# Patient Record
Sex: Female | Born: 1945 | Race: White | Hispanic: No | Marital: Married | Smoking: Never smoker
Health system: Southern US, Community
[De-identification: ages and names within clinical notes are randomized; demographics above are authoritative.]

## PROBLEM LIST (undated history)

## (undated) DIAGNOSIS — H269 Unspecified cataract: Secondary | ICD-10-CM

## (undated) DIAGNOSIS — M8580 Other specified disorders of bone density and structure, unspecified site: Secondary | ICD-10-CM

## (undated) DIAGNOSIS — R112 Nausea with vomiting, unspecified: Secondary | ICD-10-CM

## (undated) DIAGNOSIS — Z800 Family history of malignant neoplasm of digestive organs: Secondary | ICD-10-CM

## (undated) DIAGNOSIS — Z9889 Other specified postprocedural states: Secondary | ICD-10-CM

## (undated) HISTORY — PX: COLONOSCOPY: SHX174

## (undated) HISTORY — DX: Family history of malignant neoplasm of digestive organs: Z80.0

## (undated) HISTORY — PX: TUBAL LIGATION: SHX77

## (undated) HISTORY — DX: Other specified disorders of bone density and structure, unspecified site: M85.80

## (undated) HISTORY — PX: TYMPANOPLASTY: SHX33

## (undated) HISTORY — PX: EYE SURGERY: SHX253

## (undated) HISTORY — PX: WISDOM TOOTH EXTRACTION: SHX21

## (undated) HISTORY — DX: Unspecified cataract: H26.9

---

## 1998-05-21 ENCOUNTER — Other Ambulatory Visit: Admission: RE | Admit: 1998-05-21 | Discharge: 1998-05-21 | Payer: Self-pay | Admitting: Obstetrics and Gynecology

## 1998-09-04 ENCOUNTER — Other Ambulatory Visit: Admission: RE | Admit: 1998-09-04 | Discharge: 1998-09-04 | Payer: Self-pay | Admitting: Obstetrics and Gynecology

## 1999-01-20 ENCOUNTER — Other Ambulatory Visit: Admission: RE | Admit: 1999-01-20 | Discharge: 1999-01-20 | Payer: Self-pay | Admitting: Obstetrics and Gynecology

## 1999-05-25 ENCOUNTER — Other Ambulatory Visit: Admission: RE | Admit: 1999-05-25 | Discharge: 1999-05-25 | Payer: Self-pay | Admitting: Obstetrics and Gynecology

## 1999-11-20 ENCOUNTER — Other Ambulatory Visit: Admission: RE | Admit: 1999-11-20 | Discharge: 1999-11-20 | Payer: Self-pay | Admitting: Obstetrics and Gynecology

## 2000-07-29 ENCOUNTER — Ambulatory Visit (HOSPITAL_COMMUNITY): Admission: RE | Admit: 2000-07-29 | Discharge: 2000-07-29 | Payer: Self-pay | Admitting: Gastroenterology

## 2001-07-03 ENCOUNTER — Other Ambulatory Visit: Admission: RE | Admit: 2001-07-03 | Discharge: 2001-07-03 | Payer: Self-pay | Admitting: Obstetrics and Gynecology

## 2002-07-06 ENCOUNTER — Other Ambulatory Visit: Admission: RE | Admit: 2002-07-06 | Discharge: 2002-07-06 | Payer: Self-pay | Admitting: Obstetrics and Gynecology

## 2003-07-08 ENCOUNTER — Other Ambulatory Visit: Admission: RE | Admit: 2003-07-08 | Discharge: 2003-07-08 | Payer: Self-pay | Admitting: Obstetrics and Gynecology

## 2003-08-13 ENCOUNTER — Encounter: Admission: RE | Admit: 2003-08-13 | Discharge: 2003-08-27 | Payer: Self-pay | Admitting: Family Medicine

## 2004-07-28 ENCOUNTER — Other Ambulatory Visit: Admission: RE | Admit: 2004-07-28 | Discharge: 2004-07-28 | Payer: Self-pay | Admitting: Obstetrics and Gynecology

## 2005-08-09 ENCOUNTER — Other Ambulatory Visit: Admission: RE | Admit: 2005-08-09 | Discharge: 2005-08-09 | Payer: Self-pay | Admitting: Obstetrics & Gynecology

## 2010-07-01 ENCOUNTER — Ambulatory Visit (INDEPENDENT_AMBULATORY_CARE_PROVIDER_SITE_OTHER): Payer: Medicare PPO | Admitting: Internal Medicine

## 2010-07-01 ENCOUNTER — Encounter: Payer: Self-pay | Admitting: Internal Medicine

## 2010-07-01 DIAGNOSIS — Z13220 Encounter for screening for lipoid disorders: Secondary | ICD-10-CM

## 2010-07-01 DIAGNOSIS — Z23 Encounter for immunization: Secondary | ICD-10-CM

## 2010-07-01 DIAGNOSIS — M899 Disorder of bone, unspecified: Secondary | ICD-10-CM

## 2010-07-01 DIAGNOSIS — M8580 Other specified disorders of bone density and structure, unspecified site: Secondary | ICD-10-CM | POA: Insufficient documentation

## 2010-07-01 DIAGNOSIS — Z800 Family history of malignant neoplasm of digestive organs: Secondary | ICD-10-CM | POA: Insufficient documentation

## 2010-07-01 DIAGNOSIS — Z79899 Other long term (current) drug therapy: Secondary | ICD-10-CM

## 2010-07-01 DIAGNOSIS — H409 Unspecified glaucoma: Secondary | ICD-10-CM

## 2010-07-01 DIAGNOSIS — Z0000 Encounter for general adult medical examination without abnormal findings: Secondary | ICD-10-CM

## 2010-07-01 NOTE — Assessment & Plan Note (Signed)
Asymptomatic. Reviewed screening guidelines in the face of family history. Recommend five-year followup with last colonoscopy October 2007

## 2010-07-01 NOTE — Assessment & Plan Note (Signed)
Asx. Followup with optho as scheduled

## 2010-07-01 NOTE — Progress Notes (Signed)
  Subjective:    Patient ID: Tarri Fuller, female    DOB: 26-Mar-1945, 65 y.o.   MRN: [id number]  HPI patient is escorted to establish primary medical care and for followup of osteopenia. Outside records reviewed up to two thousand nine. States was diagnosed with osteopenia approximately 5 years ago with no recommendation for treatment other than over-the-counter calcium and vitamin D. Maintains regular exercise routine. Has no history of fracture. Is due for annual mammogram and is willing to have this scheduled. Pap smear today and reportedly normal July 2011 performed by gynecology. Has been diagnosed with glaucoma which apparently is mild and is followed closely by ophthalmology. Does have family history of colon cancer with first-degree relative. His personal history of colon polyps with last colonoscopy October 2007 demonstrating internal hemorrhoids and diverticula only. Recommendation was for five year followup. Remains asymptomatic without change in bowel habits or rectal bleeding. No other complaints.  Reviewed past medical history, past surgical history, medications, allergies, social history and family history  Review of Systems  Constitutional: Negative for fever, chills and fatigue.  HENT: Negative for congestion, sore throat and neck pain.   Eyes: Negative for pain, discharge and visual disturbance.  Respiratory: Negative for cough, shortness of breath and wheezing.   Cardiovascular: Negative for chest pain and palpitations.  Gastrointestinal: Negative for abdominal pain, diarrhea, constipation and blood in stool.  Genitourinary: Negative for hematuria, decreased urine volume and difficulty urinating.  Musculoskeletal: Negative for back pain and arthralgias.  Neurological: Negative for dizziness, syncope and weakness.  Hematological: Negative for adenopathy. Does not bruise/bleed easily.  Psychiatric/Behavioral: Negative for behavioral problems, confusion and agitation.         Objective:   Physical Exam    Physical Exam  [nursing notereviewed. Constitutional:  appears well-developed and well-nourished. No distress.  HENT:  Head: Normocephalic and atraumatic.  Right Ear: Tympanic membrane, external ear and ear canal normal.  Left Ear: Tympanic membrane, external ear and ear canal normal.  Nose: Nose normal.  Mouth/Throat: Oropharynx is clear and moist. No oropharyngeal exudate.  Eyes: Conjunctivae are normal. No scleral icterus.  Neck: Neck supple.  Cardiovascular: Normal rate, regular rhythm and normal heart sounds.  Exam reveals no gallop and no friction rub.   No murmur heard. Pulmonary/Chest: Effort normal and breath sounds normal. No respiratory distress.  no wheezes.  no rales.  Lymphadenopathy:     no cervical adenopathy.  Neurological:  alert.  Skin: Skin is warm and dry.  not diaphoretic.      Assessment & Plan:

## 2010-07-02 ENCOUNTER — Other Ambulatory Visit: Payer: Self-pay | Admitting: Internal Medicine

## 2010-07-02 DIAGNOSIS — Z1231 Encounter for screening mammogram for malignant neoplasm of breast: Secondary | ICD-10-CM

## 2010-07-02 DIAGNOSIS — M8580 Other specified disorders of bone density and structure, unspecified site: Secondary | ICD-10-CM

## 2010-07-02 LAB — LIPID PANEL
Cholesterol: 178 mg/dL (ref 0–200)
HDL: 53.7 mg/dL
LDL Cholesterol: 112 mg/dL — ABNORMAL HIGH (ref 0–99)
Total CHOL/HDL Ratio: 3
Triglycerides: 61 mg/dL (ref 0.0–149.0)
VLDL: 12.2 mg/dL (ref 0.0–40.0)

## 2010-07-02 LAB — BASIC METABOLIC PANEL
CO2: 27 mEq/L (ref 19–32)
Calcium: 9.2 mg/dL (ref 8.4–10.5)
Creatinine, Ser: 1 mg/dL (ref 0.4–1.2)
Glucose, Bld: 79 mg/dL (ref 70–99)

## 2010-07-02 NOTE — Progress Notes (Signed)
Addended by: Serena Colonel on: 07/02/2010 08:37 AM   Modules accepted: Orders

## 2010-07-06 ENCOUNTER — Telehealth: Payer: Self-pay

## 2010-07-06 NOTE — Telephone Encounter (Signed)
Pt's husband notified.

## 2010-07-06 NOTE — Telephone Encounter (Signed)
Message copied by Beverely Low on Mon Jul 06, 2010  3:30 PM ------      Message from: Staci Righter      Created: Sun Jul 05, 2010 11:38 AM       Labs nl

## 2010-07-09 ENCOUNTER — Other Ambulatory Visit: Payer: Medicare PPO

## 2010-08-04 ENCOUNTER — Ambulatory Visit: Payer: Medicare PPO

## 2010-08-05 ENCOUNTER — Telehealth: Payer: Self-pay | Admitting: Internal Medicine

## 2010-08-05 NOTE — Telephone Encounter (Signed)
Please advise, I can do the order

## 2010-08-05 NOTE — Telephone Encounter (Signed)
Pt called and said that she would like to remain at [facility]. Pt said that Dr Rodena Medin had ordered a dexa scan for pt a [facility], but they do not take pts insurance. Pt is req to get another order for bone density, sent to [facility] phone # [phone] and fax # [phone].

## 2010-08-05 NOTE — Telephone Encounter (Signed)
Order faxed, confirmation received

## 2010-08-05 NOTE — Telephone Encounter (Signed)
OK to order 

## 2010-09-29 LAB — HM MAMMOGRAPHY: HM Mammogram: NEGATIVE

## 2010-10-15 ENCOUNTER — Encounter: Payer: Self-pay | Admitting: Internal Medicine

## 2010-10-15 ENCOUNTER — Encounter: Payer: Self-pay | Admitting: Family Medicine

## 2010-10-19 ENCOUNTER — Ambulatory Visit (INDEPENDENT_AMBULATORY_CARE_PROVIDER_SITE_OTHER): Payer: PRIVATE HEALTH INSURANCE | Admitting: Internal Medicine

## 2010-10-19 ENCOUNTER — Encounter: Payer: Self-pay | Admitting: Internal Medicine

## 2010-10-19 ENCOUNTER — Ambulatory Visit (HOSPITAL_BASED_OUTPATIENT_CLINIC_OR_DEPARTMENT_OTHER)
Admission: RE | Admit: 2010-10-19 | Discharge: 2010-10-19 | Disposition: A | Discharge status: Home / Self Care | Payer: PRIVATE HEALTH INSURANCE | Source: Ambulatory Visit | Attending: Internal Medicine | Admitting: Internal Medicine

## 2010-10-19 VITALS — BP 104/70 | HR 69 | Temp 97.9°F | Resp 16 | Ht 67.0 in | Wt 139.0 lb

## 2010-10-19 DIAGNOSIS — M79672 Pain in left foot: Secondary | ICD-10-CM

## 2010-10-19 DIAGNOSIS — M79609 Pain in unspecified limb: Secondary | ICD-10-CM

## 2010-10-19 DIAGNOSIS — M899 Disorder of bone, unspecified: Secondary | ICD-10-CM

## 2010-10-19 DIAGNOSIS — M8580 Other specified disorders of bone density and structure, unspecified site: Secondary | ICD-10-CM

## 2010-10-19 MED ORDER — DICLOFENAC SODIUM 75 MG PO TBEC: DELAYED_RELEASE_TABLET | ORAL | Status: AC

## 2010-10-19 NOTE — Assessment & Plan Note (Signed)
Reviewed updated bmd. Continue calcium and vit d supplementation.

## 2010-10-19 NOTE — Progress Notes (Signed)
  Subjective:    Patient ID: Stacy White, female    DOB: [birth year], 65 y.o.   MRN: [id number]  HPI Pt presents to clinic for evaluation of foot pain. Notes 2wk h/o left foot pain now improved. Originally occurred while running the end of a 5k race. Pain developed but was able to finish. After finishing had difficulty with wt bear and used crunches temporarily. There was no specific injury or trauma. Pain has slowed improved and is entirely off crutch support. Does note pain at posterior upper heel. Also pain worse in am with initial steps and improves during day. No arch pain. Has taken otc nsaid periodically and initially attempted ice and elevation. No other alleviating or exacerbating factors. Reviewed recent BMD mildly worse but c/w low bone mass without osteoporosis. Continues to take calcium and vitamin d supplementation. No other complaints.  Past Medical History  Diagnosis Date  . Glaucoma    Past Surgical History  Procedure Date  . Tubal ligation   . Tympanoplasty     reports that she has never smoked. She has never used smokeless tobacco. She reports that she does not drink alcohol or use illicit drugs. family history includes Cancer in her father and Hypertension in her father and mother. No Known Allergies     Review of Systems see hpi     Objective:   Physical Exam  Nursing note and vitals reviewed. Constitutional: She appears well-developed. No distress.  HENT:  Head: Normocephalic and atraumatic.  Musculoskeletal:       Left foot without swelling or bony abn. FROM including achilles tendon. No heel tenderness. Able to wt bear and ambulate without assistance.   Neurological: She is alert.  Skin: Skin is warm and dry. She is not diaphoretic.  Psychiatric: She has a normal mood and affect.          Assessment & Plan:

## 2010-10-19 NOTE — Assessment & Plan Note (Signed)
Obtain plain radiograph of left foot. Dc otc nsaid. Begin voltaren with food and no other nsaids. Followup if no improvement or worsening.

## 2010-10-26 ENCOUNTER — Ambulatory Visit (AMBULATORY_SURGERY_CENTER): Payer: PRIVATE HEALTH INSURANCE | Admitting: *Deleted

## 2010-10-26 ENCOUNTER — Telehealth: Payer: Self-pay | Admitting: *Deleted

## 2010-10-26 VITALS — Ht 67.0 in | Wt 141.0 lb

## 2010-10-26 DIAGNOSIS — Z1211 Encounter for screening for malignant neoplasm of colon: Secondary | ICD-10-CM

## 2010-10-26 MED ORDER — PEG-KCL-NACL-NASULF-NA ASC-C 100 G PO SOLR: ORAL | Status: DC

## 2010-10-26 NOTE — Telephone Encounter (Signed)
Pt is former pt of Dr. Ewing Schlein.  We have records from 2002, but not records from colonoscopy 2007.  Pt has hx colon polyps and mother had colon cancer.  Colonoscopy scheduled for Monday 10/22 with Dr. Rhea Belton.  Release of information form signed and given to Chales Abrahams, CMA.   Ezra Sites

## 2010-10-26 NOTE — Progress Notes (Signed)
Pt is former pt of Dr. Magod.  We have records from 2002, but not records from colonoscopy 2007.  Pt has hx colon polyps and mother had colon cancer.  Colonoscopy scheduled for Monday 10/22 with Dr. Pyrtle.  Release of information form signed and given to Patty Lewis, CMA.   Meghan Warshawsky  

## 2010-10-28 NOTE — Telephone Encounter (Signed)
Release faxed. 11/09/10 appt is scheduled.

## 2010-10-30 ENCOUNTER — Telehealth: Payer: Self-pay | Admitting: Internal Medicine

## 2010-10-30 NOTE — Telephone Encounter (Signed)
Forwarded to Dr. Pyrtle for review.  °

## 2010-11-09 ENCOUNTER — Ambulatory Visit (AMBULATORY_SURGERY_CENTER): Payer: 59 | Admitting: Internal Medicine

## 2010-11-09 ENCOUNTER — Encounter: Payer: Self-pay | Admitting: Internal Medicine

## 2010-11-09 VITALS — BP 159/77 | HR 68 | Temp 98.1°F | Resp 18 | Ht 67.0 in | Wt 141.0 lb

## 2010-11-09 DIAGNOSIS — Z800 Family history of malignant neoplasm of digestive organs: Secondary | ICD-10-CM

## 2010-11-09 DIAGNOSIS — Z1211 Encounter for screening for malignant neoplasm of colon: Secondary | ICD-10-CM

## 2010-11-09 DIAGNOSIS — D126 Benign neoplasm of colon, unspecified: Secondary | ICD-10-CM

## 2010-11-09 MED ORDER — SODIUM CHLORIDE 0.9 % IV SOLN: 500.0000 mL | INTRAVENOUS | Status: DC

## 2010-11-09 NOTE — Patient Instructions (Signed)
1 POLYP, MILD DIVERTICULOSIS  AWAIT PATHOLOGY RESULTS  SEE GREEN AND BLUE SHEETS FOR ADDITIONAL D/C INSTRUCTIONS.

## 2010-11-10 ENCOUNTER — Telehealth: Payer: Self-pay

## 2010-11-10 NOTE — Telephone Encounter (Signed)

## 2010-11-13 ENCOUNTER — Encounter: Payer: Self-pay | Admitting: Internal Medicine

## 2011-02-25 ENCOUNTER — Encounter: Payer: Self-pay | Admitting: Family Medicine

## 2011-03-11 ENCOUNTER — Encounter: Payer: Self-pay | Admitting: Family Medicine

## 2011-03-12 ENCOUNTER — Encounter: Payer: Self-pay | Admitting: Family Medicine

## 2011-09-03 ENCOUNTER — Encounter (INDEPENDENT_AMBULATORY_CARE_PROVIDER_SITE_OTHER): Payer: Self-pay | Admitting: Ophthalmology

## 2011-09-03 DIAGNOSIS — H43819 Vitreous degeneration, unspecified eye: Secondary | ICD-10-CM

## 2011-09-03 DIAGNOSIS — H2510 Age-related nuclear cataract, unspecified eye: Secondary | ICD-10-CM

## 2011-09-03 DIAGNOSIS — H3530 Unspecified macular degeneration: Secondary | ICD-10-CM

## 2011-09-03 DIAGNOSIS — H35349 Macular cyst, hole, or pseudohole, unspecified eye: Secondary | ICD-10-CM

## 2011-09-21 ENCOUNTER — Encounter (HOSPITAL_COMMUNITY): Payer: Self-pay | Admitting: Respiratory Therapy

## 2011-09-29 ENCOUNTER — Encounter (HOSPITAL_COMMUNITY): Payer: Self-pay | Admitting: *Deleted

## 2011-09-29 DIAGNOSIS — H35349 Macular cyst, hole, or pseudohole, unspecified eye: Secondary | ICD-10-CM

## 2011-09-29 NOTE — H&P (Signed)
Stacy White is an 66 y.o. female.   Chief Complaint: poor vision right eye  HPI: Macular hole right eye  Past Medical History  Diagnosis Date  . Glaucoma   . PONV (postoperative nausea and vomiting)     last anethesia in the 80s    Past Surgical History  Procedure Date  . Tubal ligation   . Tympanoplasty   . Colonoscopy     Family History  Problem Relation Age of Onset  . Hypertension Mother   . Cancer Father     colon  . Hypertension Father   . Colon cancer Father 45    died from colon ca  . Stomach cancer Neg Hx    Social History:  reports that she has never smoked. She has never used smokeless tobacco. She reports that she does not drink alcohol or use illicit drugs.  Allergies: No Known Allergies  No prescriptions prior to admission    Review of systems otherwise negative  Height 5\' 7"  (1.702 m), weight 140 lb (63.504 kg).  Physical exam: Mental status: oriented x3. Eyes: See eye exam associated with this date of surgery in media tab.  Scanned in by scanning center Ears, Nose, Throat: within normal limits Neck: Within Normal limits General: within normal limits Chest: Within normal limits Breast: deferred Heart: Within normal limits Abdomen: Within normal limits GU: deferred Extremities: within normal limits Skin: within normal limits  Assessment/Plan Macular hole right eye Plan: To [facility] for Pars plana vitrectomy, membrane peel, laser treatment, serum patch right eye.  Sherrie George 09/29/2011, 4:39 PM

## 2011-09-29 NOTE — Progress Notes (Signed)
Called Dr. Ashley Royalty' office, requested surgical orders for pt.

## 2011-09-30 ENCOUNTER — Ambulatory Visit (HOSPITAL_COMMUNITY): Payer: Self-pay | Admitting: Certified Registered"

## 2011-09-30 ENCOUNTER — Ambulatory Visit (HOSPITAL_COMMUNITY): Payer: Self-pay

## 2011-09-30 ENCOUNTER — Encounter (HOSPITAL_COMMUNITY)
Admission: RE | Disposition: A | Discharge status: Home / Self Care | Payer: Self-pay | Source: Ambulatory Visit | Attending: Ophthalmology

## 2011-09-30 ENCOUNTER — Encounter (HOSPITAL_COMMUNITY): Payer: Self-pay | Admitting: *Deleted

## 2011-09-30 ENCOUNTER — Encounter (HOSPITAL_COMMUNITY): Payer: Self-pay | Admitting: Certified Registered"

## 2011-09-30 ENCOUNTER — Ambulatory Visit (HOSPITAL_COMMUNITY)
Admission: RE | Admit: 2011-09-30 | Discharge: 2011-10-01 | Disposition: A | Discharge status: Home / Self Care | Payer: Self-pay | Source: Ambulatory Visit | Attending: Ophthalmology | Admitting: Ophthalmology

## 2011-09-30 DIAGNOSIS — H409 Unspecified glaucoma: Secondary | ICD-10-CM | POA: Insufficient documentation

## 2011-09-30 DIAGNOSIS — H35349 Macular cyst, hole, or pseudohole, unspecified eye: Secondary | ICD-10-CM

## 2011-09-30 DIAGNOSIS — H43319 Vitreous membranes and strands, unspecified eye: Secondary | ICD-10-CM | POA: Insufficient documentation

## 2011-09-30 HISTORY — PX: GAS INSERTION: SHX5336

## 2011-09-30 HISTORY — DX: Other specified postprocedural states: Z98.890

## 2011-09-30 HISTORY — DX: Other specified postprocedural states: R11.2

## 2011-09-30 HISTORY — PX: PARS PLANA VITRECTOMY: SHX2166

## 2011-09-30 LAB — AUTOLOGOUS SERUM PATCH PREP

## 2011-09-30 LAB — CBC
MCHC: 34.8 g/dL (ref 30.0–36.0)
Platelets: 167 10*3/uL (ref 150–400)
RDW: 12.2 % (ref 11.5–15.5)
WBC: 4.2 10*3/uL (ref 4.0–10.5)

## 2011-09-30 LAB — SURGICAL PCR SCREEN
MRSA, PCR: NEGATIVE
Staphylococcus aureus: POSITIVE — AB

## 2011-09-30 SURGERY — PARS PLANA VITRECTOMY WITH 25 GAUGE
Anesthesia: General | Site: Eye | Laterality: Right | Wound class: Clean

## 2011-09-30 MED ORDER — MIDAZOLAM HCL 5 MG/5ML IJ SOLN
INTRAMUSCULAR | Status: DC | PRN
  Administered 2011-09-30: 2 mg via INTRAVENOUS

## 2011-09-30 MED ORDER — LIDOCAINE HCL 2 % IJ SOLN
INTRAMUSCULAR | Status: AC
  Filled 2011-09-30: qty 20

## 2011-09-30 MED ORDER — MUPIROCIN 2 % EX OINT
TOPICAL_OINTMENT | Freq: Once | CUTANEOUS | Status: AC
  Administered 2011-09-30: 10:00:00 via NASAL
  Filled 2011-09-30: qty 22

## 2011-09-30 MED ORDER — DEXAMETHASONE SODIUM PHOSPHATE 4 MG/ML IJ SOLN
INTRAMUSCULAR | Status: DC | PRN
  Administered 2011-09-30: 4 mg via INTRAVENOUS

## 2011-09-30 MED ORDER — BSS IO SOLN
INTRAOCULAR | Status: DC | PRN
  Administered 2011-09-30: 15 mL via INTRAOCULAR

## 2011-09-30 MED ORDER — SODIUM HYALURONATE 10 MG/ML IO SOLN
INTRAOCULAR | Status: AC
  Filled 2011-09-30: qty 0.85

## 2011-09-30 MED ORDER — CHLORHEXIDINE GLUCONATE CLOTH 2 % EX PADS: 6.0000 | MEDICATED_PAD | Freq: Every day | CUTANEOUS | Status: DC

## 2011-09-30 MED ORDER — CYCLOPENTOLATE HCL 1 % OP SOLN
OPHTHALMIC | Status: AC
  Administered 2011-09-30: 1 [drp] via OPHTHALMIC
  Filled 2011-09-30: qty 2

## 2011-09-30 MED ORDER — LEVOBUNOLOL HCL 0.5 % OP SOLN
1.0000 [drp] | Freq: Every day | OPHTHALMIC | Status: DC
  Filled 2011-09-30: qty 5

## 2011-09-30 MED ORDER — ATROPINE SULFATE 1 % OP SOLN
OPHTHALMIC | Status: DC | PRN
  Administered 2011-09-30: 1 [drp] via OPHTHALMIC

## 2011-09-30 MED ORDER — GENTAMICIN SULFATE 40 MG/ML IJ SOLN
INTRAMUSCULAR | Status: AC
  Filled 2011-09-30: qty 2

## 2011-09-30 MED ORDER — PHENYLEPHRINE HCL 2.5 % OP SOLN
1.0000 [drp] | OPHTHALMIC | Status: AC | PRN
  Administered 2011-09-30 (×3): 1 [drp] via OPHTHALMIC
  Filled 2011-09-30: qty 3

## 2011-09-30 MED ORDER — NEOSTIGMINE METHYLSULFATE 1 MG/ML IJ SOLN
INTRAMUSCULAR | Status: DC | PRN
  Administered 2011-09-30: 3 mg via INTRAVENOUS

## 2011-09-30 MED ORDER — SUCCINYLCHOLINE CHLORIDE 20 MG/ML IJ SOLN
INTRAMUSCULAR | Status: DC | PRN
  Administered 2011-09-30: 100 mg via INTRAVENOUS

## 2011-09-30 MED ORDER — BSS PLUS IO SOLN
INTRAOCULAR | Status: AC
  Filled 2011-09-30: qty 500

## 2011-09-30 MED ORDER — ROCURONIUM BROMIDE 100 MG/10ML IV SOLN
INTRAVENOUS | Status: DC | PRN
  Administered 2011-09-30: 25 mg via INTRAVENOUS

## 2011-09-30 MED ORDER — TROPICAMIDE 1 % OP SOLN
1.0000 [drp] | OPHTHALMIC | Status: AC | PRN
  Administered 2011-09-30 (×3): 1 [drp] via OPHTHALMIC

## 2011-09-30 MED ORDER — BACITRACIN-POLYMYXIN B 500-10000 UNIT/GM OP OINT
1.0000 "application " | TOPICAL_OINTMENT | Freq: Four times a day (QID) | OPHTHALMIC | Status: DC
  Filled 2011-09-30: qty 3.5

## 2011-09-30 MED ORDER — POLYMYXIN B SULFATE 500000 UNITS IJ SOLR
INTRAMUSCULAR | Status: AC
  Filled 2011-09-30: qty 1

## 2011-09-30 MED ORDER — SODIUM CHLORIDE 0.9 % IV SOLN
INTRAVENOUS | Status: DC
  Administered 2011-09-30: 12:00:00 via INTRAVENOUS

## 2011-09-30 MED ORDER — PROPOFOL 10 MG/ML IV BOLUS
INTRAVENOUS | Status: DC | PRN
  Administered 2011-09-30: 170 mg via INTRAVENOUS

## 2011-09-30 MED ORDER — TEMAZEPAM 15 MG PO CAPS: 15.0000 mg | ORAL_CAPSULE | Freq: Every evening | ORAL | Status: DC | PRN

## 2011-09-30 MED ORDER — MUPIROCIN 2 % EX OINT
TOPICAL_OINTMENT | CUTANEOUS | Status: AC
  Filled 2011-09-30: qty 22

## 2011-09-30 MED ORDER — HYPROMELLOSE (GONIOSCOPIC) 2.5 % OP SOLN
OPHTHALMIC | Status: AC
  Filled 2011-09-30: qty 15

## 2011-09-30 MED ORDER — HEMOSTATIC AGENTS (NO CHARGE) OPTIME
TOPICAL | Status: DC | PRN
  Administered 2011-09-30: 1 via TOPICAL

## 2011-09-30 MED ORDER — DROPERIDOL 2.5 MG/ML IJ SOLN
INTRAMUSCULAR | Status: DC | PRN
  Administered 2011-09-30: 0.625 mg via INTRAVENOUS

## 2011-09-30 MED ORDER — DOCUSATE SODIUM 100 MG PO CAPS
100.0000 mg | ORAL_CAPSULE | Freq: Two times a day (BID) | ORAL | Status: DC
  Administered 2011-10-01: 100 mg via ORAL
  Filled 2011-09-30 (×2): qty 1

## 2011-09-30 MED ORDER — ONDANSETRON HCL 4 MG/2ML IJ SOLN: 4.0000 mg | Freq: Once | INTRAMUSCULAR | Status: DC | PRN

## 2011-09-30 MED ORDER — MUPIROCIN 2 % EX OINT: TOPICAL_OINTMENT | Freq: Two times a day (BID) | CUTANEOUS | Status: DC

## 2011-09-30 MED ORDER — LIDOCAINE HCL (CARDIAC) 20 MG/ML IV SOLN
INTRAVENOUS | Status: DC | PRN
  Administered 2011-09-30: 50 mg via INTRAVENOUS

## 2011-09-30 MED ORDER — DEXAMETHASONE SODIUM PHOSPHATE 10 MG/ML IJ SOLN
INTRAMUSCULAR | Status: DC | PRN
  Administered 2011-09-30: 10 mg

## 2011-09-30 MED ORDER — MUPIROCIN 2 % EX OINT
1.0000 "application " | TOPICAL_OINTMENT | Freq: Two times a day (BID) | CUTANEOUS | Status: DC
  Administered 2011-09-30: 1 via NASAL
  Filled 2011-09-30: qty 22

## 2011-09-30 MED ORDER — GATIFLOXACIN 0.5 % OP SOLN
OPHTHALMIC | Status: AC
  Administered 2011-09-30: 1 [drp] via OPHTHALMIC
  Filled 2011-09-30: qty 2.5

## 2011-09-30 MED ORDER — PREDNISOLONE ACETATE 1 % OP SUSP
1.0000 [drp] | Freq: Four times a day (QID) | OPHTHALMIC | Status: DC
  Filled 2011-09-30: qty 1

## 2011-09-30 MED ORDER — BACITRACIN-POLYMYXIN B 500-10000 UNIT/GM OP OINT
TOPICAL_OINTMENT | OPHTHALMIC | Status: DC | PRN
  Administered 2011-09-30: 1 via OPHTHALMIC

## 2011-09-30 MED ORDER — FENTANYL CITRATE 0.05 MG/ML IJ SOLN
INTRAMUSCULAR | Status: DC | PRN
  Administered 2011-09-30: 100 ug via INTRAVENOUS

## 2011-09-30 MED ORDER — BRIMONIDINE TARTRATE 0.2 % OP SOLN
1.0000 [drp] | Freq: Two times a day (BID) | OPHTHALMIC | Status: DC
  Filled 2011-09-30: qty 5

## 2011-09-30 MED ORDER — ACETAMINOPHEN 325 MG PO TABS: 325.0000 mg | ORAL_TABLET | ORAL | Status: DC | PRN

## 2011-09-30 MED ORDER — HYDROMORPHONE HCL PF 1 MG/ML IJ SOLN: 0.2500 mg | INTRAMUSCULAR | Status: DC | PRN

## 2011-09-30 MED ORDER — HYDROCODONE-ACETAMINOPHEN 5-325 MG PO TABS: 1.0000 | ORAL_TABLET | ORAL | Status: DC | PRN

## 2011-09-30 MED ORDER — BUPIVACAINE HCL 0.75 % IJ SOLN
INTRAMUSCULAR | Status: AC
  Filled 2011-09-30: qty 10

## 2011-09-30 MED ORDER — EPINEPHRINE HCL 1 MG/ML IJ SOLN
INTRAOCULAR | Status: DC | PRN
  Administered 2011-09-30: 12:00:00

## 2011-09-30 MED ORDER — ONDANSETRON HCL 4 MG/2ML IJ SOLN: 4.0000 mg | Freq: Four times a day (QID) | INTRAMUSCULAR | Status: DC | PRN

## 2011-09-30 MED ORDER — BUPIVACAINE HCL 0.75 % IJ SOLN
INTRAMUSCULAR | Status: DC | PRN
  Administered 2011-09-30: 10 mL

## 2011-09-30 MED ORDER — ONDANSETRON HCL 4 MG/2ML IJ SOLN
INTRAMUSCULAR | Status: DC | PRN
  Administered 2011-09-30: 4 mg via INTRAVENOUS

## 2011-09-30 MED ORDER — ACETAZOLAMIDE SODIUM 500 MG IJ SOLR
500.0000 mg | Freq: Once | INTRAMUSCULAR | Status: AC
  Administered 2011-10-01: 500 mg via INTRAVENOUS
  Filled 2011-09-30: qty 500

## 2011-09-30 MED ORDER — TETRACAINE HCL 0.5 % OP SOLN
2.0000 [drp] | Freq: Once | OPHTHALMIC | Status: DC
  Filled 2011-09-30: qty 2

## 2011-09-30 MED ORDER — TROPICAMIDE 1 % OP SOLN
OPHTHALMIC | Status: AC
  Administered 2011-09-30: 1 [drp] via OPHTHALMIC
  Filled 2011-09-30: qty 3

## 2011-09-30 MED ORDER — DEXAMETHASONE SODIUM PHOSPHATE 10 MG/ML IJ SOLN
INTRAMUSCULAR | Status: AC
  Filled 2011-09-30: qty 1

## 2011-09-30 MED ORDER — MORPHINE SULFATE 2 MG/ML IJ SOLN: 1.0000 mg | INTRAMUSCULAR | Status: DC | PRN

## 2011-09-30 MED ORDER — BACITRACIN-POLYMYXIN B 500-10000 UNIT/GM OP OINT
TOPICAL_OINTMENT | OPHTHALMIC | Status: AC
  Filled 2011-09-30: qty 3.5

## 2011-09-30 MED ORDER — BSS IO SOLN
INTRAOCULAR | Status: AC
  Filled 2011-09-30: qty 15

## 2011-09-30 MED ORDER — ATROPINE SULFATE 1 % OP SOLN
OPHTHALMIC | Status: AC
  Filled 2011-09-30: qty 2

## 2011-09-30 MED ORDER — MAGNESIUM HYDROXIDE 400 MG/5ML PO SUSP: 15.0000 mL | Freq: Four times a day (QID) | ORAL | Status: DC | PRN

## 2011-09-30 MED ORDER — TRIAMCINOLONE ACETONIDE 40 MG/ML IJ SUSP
INTRAMUSCULAR | Status: AC
  Filled 2011-09-30: qty 1

## 2011-09-30 MED ORDER — GATIFLOXACIN 0.5 % OP SOLN
1.0000 [drp] | Freq: Four times a day (QID) | OPHTHALMIC | Status: DC
  Filled 2011-09-30: qty 2.5

## 2011-09-30 MED ORDER — GATIFLOXACIN 0.5 % OP SOLN
1.0000 [drp] | OPHTHALMIC | Status: AC | PRN
  Administered 2011-09-30 (×3): 1 [drp] via OPHTHALMIC

## 2011-09-30 MED ORDER — SODIUM CHLORIDE 0.9 % IJ SOLN
INTRAMUSCULAR | Status: DC | PRN
  Administered 2011-09-30: 12:00:00

## 2011-09-30 MED ORDER — LATANOPROST 0.005 % OP SOLN
1.0000 [drp] | Freq: Every day | OPHTHALMIC | Status: DC
  Filled 2011-09-30: qty 2.5

## 2011-09-30 MED ORDER — CEFAZOLIN SODIUM-DEXTROSE 2-3 GM-% IV SOLR
2.0000 g | INTRAVENOUS | Status: AC
  Administered 2011-09-30: 2 g via INTRAVENOUS
  Filled 2011-09-30: qty 50

## 2011-09-30 MED ORDER — CYCLOPENTOLATE HCL 1 % OP SOLN
1.0000 [drp] | OPHTHALMIC | Status: AC | PRN
  Administered 2011-09-30 (×3): 1 [drp] via OPHTHALMIC

## 2011-09-30 MED ORDER — GLYCOPYRROLATE 0.2 MG/ML IJ SOLN
INTRAMUSCULAR | Status: DC | PRN
  Administered 2011-09-30: 0.4 mg via INTRAVENOUS

## 2011-09-30 MED ORDER — EPINEPHRINE HCL 1 MG/ML IJ SOLN
INTRAMUSCULAR | Status: AC
  Filled 2011-09-30: qty 1

## 2011-09-30 MED ORDER — SODIUM CHLORIDE 0.45 % IV SOLN
INTRAVENOUS | Status: DC
  Administered 2011-09-30: 18:00:00 via INTRAVENOUS

## 2011-09-30 MED ORDER — MINERAL OIL LIGHT 100 % EX OIL
TOPICAL_OIL | CUTANEOUS | Status: AC
  Filled 2011-09-30: qty 25

## 2011-09-30 SURGICAL SUPPLY — 74 items
APL SRG 3 HI ABS STRL LF PLS (MISCELLANEOUS) ×1
APPLICATOR DR MATTHEWS STRL (MISCELLANEOUS) ×1 IMPLANT
BALL CTTN LRG ABS STRL LF (GAUZE/BANDAGES/DRESSINGS) ×3
BLADE EYE CATARACT 19 1.4 BEAV (BLADE) IMPLANT
BLADE MVR KNIFE 19G (BLADE) ×1 IMPLANT
BLADE MVR KNIFE 20G (BLADE) IMPLANT
CANNULA DUAL BORE 23G (CANNULA) IMPLANT
CANNULA FLEX TIP 25G (CANNULA) ×1 IMPLANT
CLOTH BEACON ORANGE TIMEOUT ST (SAFETY) ×2 IMPLANT
CORDS BIPOLAR (ELECTRODE) ×1 IMPLANT
COTTONBALL LRG STERILE PKG (GAUZE/BANDAGES/DRESSINGS) ×6 IMPLANT
DRAPE INCISE 51X51 W/FILM STRL (DRAPES) ×1 IMPLANT
DRAPE OPHTHALMIC 77X100 STRL (CUSTOM PROCEDURE TRAY) ×2 IMPLANT
FILTER BLUE MILLIPORE (MISCELLANEOUS) ×1 IMPLANT
FILTER STRAW FLUID ASPIR (MISCELLANEOUS) ×1 IMPLANT
FORCEPS ECKARDT ILM 25G SERR (OPHTHALMIC RELATED) ×1 IMPLANT
GAS OPHTHALMIC (MISCELLANEOUS) ×1 IMPLANT
GLOVE ECLIPSE 6.5 STRL STRAW (GLOVE) ×1 IMPLANT
GLOVE SS BIOGEL STRL SZ 6.5 (GLOVE) ×1 IMPLANT
GLOVE SS BIOGEL STRL SZ 7 (GLOVE) ×1 IMPLANT
GLOVE SUPERSENSE BIOGEL SZ 6.5 (GLOVE) ×1
GLOVE SUPERSENSE BIOGEL SZ 7 (GLOVE) ×1
GLOVE SURG 8.5 LATEX PF (GLOVE) ×2 IMPLANT
GLOVE SURG SS PI 6.5 STRL IVOR (GLOVE) ×1 IMPLANT
GOWN STRL NON-REIN LRG LVL3 (GOWN DISPOSABLE) ×7 IMPLANT
ILLUMINATOR CHOW PICK 25GA (MISCELLANEOUS) ×2 IMPLANT
KIT BASIN OR (CUSTOM PROCEDURE TRAY) ×2 IMPLANT
KIT ROOM TURNOVER OR (KITS) ×2 IMPLANT
KNIFE CRESCENT 2.5 55 ANG (BLADE) ×1 IMPLANT
LENS BIOM SUPER VIEW SET DISP (OPHTHALMIC RELATED) IMPLANT
MARKER SKIN DUAL TIP RULER LAB (MISCELLANEOUS) IMPLANT
MASK EYE SHIELD (GAUZE/BANDAGES/DRESSINGS) ×1 IMPLANT
MICROPICK 25G (MISCELLANEOUS)
NDL 18GX1X1/2 (RX/OR ONLY) (NEEDLE) ×1 IMPLANT
NDL 25GX 5/8IN NON SAFETY (NEEDLE) ×1 IMPLANT
NDL FILTER BLUNT 18X1 1/2 (NEEDLE) ×1 IMPLANT
NDL HYPO 30X.5 LL (NEEDLE) ×1 IMPLANT
NEEDLE 18GX1X1/2 (RX/OR ONLY) (NEEDLE) ×2 IMPLANT
NEEDLE 25GX 5/8IN NON SAFETY (NEEDLE) ×2 IMPLANT
NEEDLE 27GAX1X1/2 (NEEDLE) IMPLANT
NEEDLE FILTER BLUNT 18X 1/2SAF (NEEDLE) ×1
NEEDLE FILTER BLUNT 18X1 1/2 (NEEDLE) ×1 IMPLANT
NEEDLE HYPO 30X.5 LL (NEEDLE) ×2 IMPLANT
NS IRRIG 1000ML POUR BTL (IV SOLUTION) ×2 IMPLANT
PACK VITRECTOMY CUSTOM (CUSTOM PROCEDURE TRAY) ×2 IMPLANT
PAD ARMBOARD 7.5X6 YLW CONV (MISCELLANEOUS) ×4 IMPLANT
PAD EYE OVAL STERILE LF (GAUZE/BANDAGES/DRESSINGS) ×1 IMPLANT
PAK VITRECTOMY PIK 25 GA (OPHTHALMIC RELATED) ×2 IMPLANT
PENCIL BIPOLAR 25GA STR DISP (OPHTHALMIC RELATED) ×1 IMPLANT
PICK MICROPICK 25G (MISCELLANEOUS) IMPLANT
PROBE DIRECTIONAL LASER (MISCELLANEOUS) ×1 IMPLANT
REPL STRA BRUSH NDL (NEEDLE) IMPLANT
REPL STRA BRUSH NEEDLE (NEEDLE) IMPLANT
RESERVOIR BACK FLUSH (MISCELLANEOUS) ×1 IMPLANT
ROLLS DENTAL (MISCELLANEOUS) ×4 IMPLANT
SCRAPER DIAMOND DUST MEMBRANE (MISCELLANEOUS) ×1 IMPLANT
SPONGE SURGIFOAM ABS GEL 12-7 (HEMOSTASIS) ×2 IMPLANT
STOPCOCK 4 WAY LG BORE MALE ST (IV SETS) IMPLANT
SUT CHROMIC 7 0 TG140 8 (SUTURE) IMPLANT
SUT ETHILON 10 0 CS140 6 (SUTURE) IMPLANT
SUT ETHILON 9 0 TG140 8 (SUTURE) ×1 IMPLANT
SUT POLY NON ABSORB 10-0 8 STR (SUTURE) IMPLANT
SUT SILK 4 0 RB 1 (SUTURE) IMPLANT
SYR 20CC LL (SYRINGE) ×2 IMPLANT
SYR 5ML LL (SYRINGE) IMPLANT
SYR BULB 3OZ (MISCELLANEOUS) ×2 IMPLANT
SYR TB 1ML LUER SLIP (SYRINGE) ×2 IMPLANT
SYRINGE 10CC LL (SYRINGE) ×1 IMPLANT
TAPE SURG TRANSPORE 1 IN (GAUZE/BANDAGES/DRESSINGS) IMPLANT
TAPE SURGICAL TRANSPORE 1 IN (GAUZE/BANDAGES/DRESSINGS) ×1
TOWEL OR 17X24 6PK STRL BLUE (TOWEL DISPOSABLE) ×6 IMPLANT
TROCAR CANNULA 25GA (CANNULA) IMPLANT
WATER STERILE IRR 1000ML POUR (IV SOLUTION) ×2 IMPLANT
WIPE INSTRUMENT VISIWIPE 73X73 (MISCELLANEOUS) ×2 IMPLANT

## 2011-09-30 NOTE — Progress Notes (Signed)
Eating ice chips without difficulty attempted to void

## 2011-09-30 NOTE — Anesthesia Preprocedure Evaluation (Signed)
Anesthesia Evaluation  Patient identified by MRN, date of birth, ID band Patient awake    Reviewed: Allergy & Precautions, H&P , NPO status , Patient's Chart, lab work & pertinent test results  Airway Mallampati: I TM Distance: >3 FB Neck ROM: full    Dental   Pulmonary          Cardiovascular Rhythm:regular Rate:Normal     Neuro/Psych    GI/Hepatic   Endo/Other    Renal/GU      Musculoskeletal   Abdominal   Peds  Hematology   Anesthesia Other Findings   Reproductive/Obstetrics                           Anesthesia Physical Anesthesia Plan  ASA: I  Anesthesia Plan: General   Post-op Pain Management:    Induction: Intravenous  Airway Management Planned: Oral ETT  Additional Equipment:   Intra-op Plan:   Post-operative Plan: Extubation in OR  Informed Consent: I have reviewed the patients History and Physical, chart, labs and discussed the procedure including the risks, benefits and alternatives for the proposed anesthesia with the patient or authorized representative who has indicated his/her understanding and acceptance.     Plan Discussed with: CRNA, Anesthesiologist and Surgeon  Anesthesia Plan Comments:         Anesthesia Quick Evaluation  

## 2011-09-30 NOTE — Brief Op Note (Signed)
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  Assistant:  Rosalie Doctor SA    Anesthesia: General  Specimen: none  Estimated blood loss:  1cc  Complications: none  Patient sent to PACU in good condition  Composed by Sherrie George MD  Dictation number: [phone]

## 2011-09-30 NOTE — H&P (Signed)
I examined the patient today and there is no change in the medical status 

## 2011-09-30 NOTE — Anesthesia Postprocedure Evaluation (Signed)
  Anesthesia Post-op Note  Patient: Stacy White  Procedure(s) Performed: Procedure(s) (LRB) with comments: PARS PLANA VITRECTOMY WITH 25 GAUGE (Right) - Macular Hole PHOTOCOAGULATION WITH LASER (Right) - Headscope Laser MEMBRANE PEEL (Right) SERUM PATCH (Right) INSERTION OF GAS (Right) - C3F8  Patient Location: PACU  Anesthesia Type: General  Level of Consciousness: awake, alert , oriented and patient cooperative  Airway and Oxygen Therapy: Patient Spontanous Breathing and Patient connected to nasal cannula oxygen  Post-op Pain: none  Post-op Assessment: Post-op Vital signs reviewed, Patient's Cardiovascular Status Stable, Respiratory Function Stable, Patent Airway, No signs of Nausea or vomiting and Pain level controlled  Post-op Vital Signs: stable  Complications: No apparent anesthesia complications

## 2011-09-30 NOTE — Progress Notes (Signed)
Dr. Ashley Royalty Called back and said he is aware but will not intervene at this time.  He suggested to call Hospitalist..  Hospitalist on call did not want to give medication for the BP 150/90 and HR 85.  Information was conveyed to pt. At this time.  Nurse will continue to monitor BP every 4 hours. thru the night.

## 2011-09-30 NOTE — Transfer of Care (Signed)
Immediate Anesthesia Transfer of Care Note  Patient: Stacy White  Procedure(s) Performed: Procedure(s) (LRB) with comments: PARS PLANA VITRECTOMY WITH 25 GAUGE (Right) - Macular Hole PHOTOCOAGULATION WITH LASER (Right) - Headscope Laser MEMBRANE PEEL (Right) SERUM PATCH (Right) INSERTION OF GAS (Right) - C3F8  Patient Location: PACU  Anesthesia Type: General  Level of Consciousness: awake  Airway & Oxygen Therapy: Patient Spontanous Breathing and Patient connected to nasal cannula oxygen  Post-op Assessment: Report given to PACU RN, Post -op Vital signs reviewed and stable and Patient moving all extremities  Post vital signs: Reviewed and stable  Complications: No apparent anesthesia complications

## 2011-09-30 NOTE — Preoperative (Signed)
Beta Blockers   Reason not to administer Beta Blockers:Not Applicable 

## 2011-09-30 NOTE — Progress Notes (Signed)
Placed call to Dr Ashley Royalty on call line.  Left message to call have Dr. Call me back regarding Pt. BP of 150/90 with pulse 85 manually.

## 2011-09-30 NOTE — Progress Notes (Signed)
ARRIVED TO ROOM 6N#2- A/Ox4, ambulated to BR- gait steady, husband at bedside, pt placed in facedown position, denies nausea/pain, oriented to room and surroundings.

## 2011-10-01 MED ORDER — PREDNISOLONE ACETATE 1 % OP SUSP: 1.0000 [drp] | Freq: Four times a day (QID) | OPHTHALMIC | Status: AC

## 2011-10-01 MED ORDER — BRIMONIDINE TARTRATE 0.2 % OP SOLN: 1.0000 [drp] | Freq: Two times a day (BID) | OPHTHALMIC | Status: DC

## 2011-10-01 MED ORDER — BACITRACIN-POLYMYXIN B 500-10000 UNIT/GM OP OINT: 1.0000 "application " | TOPICAL_OINTMENT | Freq: Four times a day (QID) | OPHTHALMIC | Status: AC

## 2011-10-01 MED ORDER — GATIFLOXACIN 0.5 % OP SOLN: 1.0000 [drp] | Freq: Four times a day (QID) | OPHTHALMIC | Status: DC

## 2011-10-01 NOTE — Op Note (Signed)
Stacy White, Stacy White                 ACCOUNT NO.:  [id number]  MEDICAL RECORD NO.:  [id number]  LOCATION:  6N02C                        FACILITY:  [facility]  PHYSICIAN:  Beulah Gandy. Ashley Royalty, M.D. DATE OF BIRTH:  [birth year]  DATE OF PROCEDURE:  09/30/2011 DATE OF DISCHARGE:                              OPERATIVE REPORT   ADMISSION DIAGNOSIS:  Macular hole, right eye.  PROCEDURES:  Pars plana vitrectomy, membrane peel, retinal photocoagulation, serum patch, removal of IOM, gas fluid exchange, and C3F8 injection; all in the right eye.  SURGEON:  Beulah Gandy. Ashley Royalty, M.D.  ASSISTANT:  Rosalie Doctor SA  ANESTHESIA:  General.  DETAILS:  Usual prep and drape, the indirect ophthalmoscope laser was moved into place.  Inspection of the periphery showed several weak areas of the retina.  681 burns were placed around the retinal periphery with a power of 461-460 mW 0.1 seconds each in 1000 microns each in size. Attention was carried to the pars plana area where 25-gauge trocars were placed at 8 and 10 o'clock with a 20-gauge opening at 2 o'clock in the conjunctiva and sclera.  Contact lens ring anchored into place at 6 and 12 o'clock.  Provisc placed on the corneal surface.  The flat contact lens was placed.  Pars plana vitrectomy was begun just behind the cataractous lens.  The vitrectomy was carried posteriorly down to the macular region where vitreous membranes were encountered on the macula and the disk.  Once the core vitrectomy was completed, the silicone tip suction line was drawn down to the macular surface and the fish strike sign occurred.  The posterior hyaloid peeled nicely off the macular region and the edges of the hole became freed at that point.  The internal limiting membrane was then removed with the diamond-dusted membrane scraper for 360 degrees around the hole and for approximately disk diameter around the hole.  The vitrectomy was carried in the mid periphery and far periphery  where vitreous debris was carefully removed under low suction and rapid cutting.  The wide field 30-degree prismatic lens was used for peripheral viewing and scleral depression was used.  A total gas fluid exchange was performed.  Additional fluid was allowed to track down the walls of the eye and collect in the posterior segment. During this time, the serum patch was prepared and the C3F8 mixture was prepared to a mixture of 15%.  The serum patch was delivered and additional fluid was removed from the posterior segment.  C3F8 15% was exchanged for intravitreal gas.  9-0 nylon was used to close the sclerotomy site at 2 o'clock.  The trocars were removed at 8 and 10 o'clock.  The conjunctiva was allowed to slide over the scleral wounds. The conjunctiva at 2 o'clock was closed with wet-field cautery. Polymyxin and gentamicin were irrigated into tenon space.  Atropine solution was applied.  Marcaine was injected around the globe for postop pain. Decadron 10 mg was injected into the lower subconjunctival space. Closing pressure was measured at 10 mmHg with Matt Holmes keratometer. Polysporin ointment and patch and shield were placed.  The patient was awakened, taken to recovery in satisfactory condition.     Beulah Gandy.  Ashley Royalty, M.D.     JDM/MEDQ  D:  09/30/2011  T:  10/01/2011  Job:  [id number]

## 2011-10-01 NOTE — Progress Notes (Signed)
10/01/2011, 6:39 AM  Mental Status:  Awake, Alert, Oriented  Anterior segment: Cornea  Clear blood spot    Anterior Chamber Clear    Lens:   Cataract  Intra Ocular Pressure 25 mmHg with Tonopen  Vitreous: Clear 90%gas bubble  Retina:  Attached Good laser reaction  Impression: Excellent result Retina attached  Final Diagnosis: Principal Problem:  *Macular hole   Plan: start post operative eye drops.  Discharge to home.  Give post operative instructions  Sherrie George 10/01/2011, 6:39 AM

## 2011-10-01 NOTE — Discharge Summary (Signed)
Discharge summary not needed on [facility] patients per medical records. 

## 2011-10-01 NOTE — Progress Notes (Signed)
Pt. Has met goals. Dr. Ashley Royalty has advices and educated on eye care and return to see Doc information. Nurses reviewed D/C information to pt.  Pt. Will discharge home with husband as caregiver 0700 10/01/11.

## 2011-10-04 ENCOUNTER — Encounter (HOSPITAL_COMMUNITY): Payer: Self-pay | Admitting: Ophthalmology

## 2011-10-07 ENCOUNTER — Inpatient Hospital Stay (INDEPENDENT_AMBULATORY_CARE_PROVIDER_SITE_OTHER): Payer: Self-pay | Admitting: Ophthalmology

## 2011-10-07 DIAGNOSIS — H35349 Macular cyst, hole, or pseudohole, unspecified eye: Secondary | ICD-10-CM

## 2011-10-28 ENCOUNTER — Encounter (INDEPENDENT_AMBULATORY_CARE_PROVIDER_SITE_OTHER): Payer: Self-pay | Admitting: Ophthalmology

## 2011-10-28 DIAGNOSIS — H33329 Round hole, unspecified eye: Secondary | ICD-10-CM

## 2012-01-06 ENCOUNTER — Encounter (INDEPENDENT_AMBULATORY_CARE_PROVIDER_SITE_OTHER): Payer: Medicare Other | Admitting: Ophthalmology

## 2012-01-06 DIAGNOSIS — H35349 Macular cyst, hole, or pseudohole, unspecified eye: Secondary | ICD-10-CM

## 2012-01-06 DIAGNOSIS — H43819 Vitreous degeneration, unspecified eye: Secondary | ICD-10-CM

## 2012-01-06 DIAGNOSIS — H2510 Age-related nuclear cataract, unspecified eye: Secondary | ICD-10-CM

## 2012-02-07 DIAGNOSIS — Z1231 Encounter for screening mammogram for malignant neoplasm of breast: Secondary | ICD-10-CM | POA: Diagnosis not present

## 2012-02-09 DIAGNOSIS — R920 Mammographic microcalcification found on diagnostic imaging of breast: Secondary | ICD-10-CM | POA: Diagnosis not present

## 2012-02-10 ENCOUNTER — Encounter: Payer: Self-pay | Admitting: Internal Medicine

## 2012-02-23 NOTE — Progress Notes (Signed)
Quick Note:  Patient states she already went and it was good ______

## 2012-02-24 DIAGNOSIS — H2510 Age-related nuclear cataract, unspecified eye: Secondary | ICD-10-CM | POA: Diagnosis not present

## 2012-02-24 DIAGNOSIS — H4011X0 Primary open-angle glaucoma, stage unspecified: Secondary | ICD-10-CM | POA: Diagnosis not present

## 2012-03-04 ENCOUNTER — Other Ambulatory Visit: Payer: Self-pay

## 2012-06-29 ENCOUNTER — Encounter: Payer: Self-pay | Admitting: Family Medicine

## 2012-06-29 ENCOUNTER — Ambulatory Visit (INDEPENDENT_AMBULATORY_CARE_PROVIDER_SITE_OTHER): Payer: Medicare Other | Admitting: Family Medicine

## 2012-06-29 VITALS — BP 103/69 | HR 67 | Temp 97.8°F | Resp 14 | Ht 67.25 in | Wt 144.5 lb

## 2012-06-29 DIAGNOSIS — H409 Unspecified glaucoma: Secondary | ICD-10-CM

## 2012-06-29 DIAGNOSIS — M899 Disorder of bone, unspecified: Secondary | ICD-10-CM | POA: Diagnosis not present

## 2012-06-29 DIAGNOSIS — Z800 Family history of malignant neoplasm of digestive organs: Secondary | ICD-10-CM

## 2012-06-29 DIAGNOSIS — M949 Disorder of cartilage, unspecified: Secondary | ICD-10-CM

## 2012-06-29 DIAGNOSIS — M8580 Other specified disorders of bone density and structure, unspecified site: Secondary | ICD-10-CM

## 2012-06-29 NOTE — Progress Notes (Signed)
Office Note 07/25/2012  CC:  Chief Complaint  Patient presents with  . Establish Care    NP transfer [Dr. Hodgin]    HPI:  Stacy White is a 67 y.o. White female who is here to establish/transfer care. Patient's most recent primary MD: Dr. Caryl Never.  Has a GYN: gets annual mammograms (most recent 03/2012-repeat in 1 yr).  Has pap/pelvic appt with her GYN. Old records in [facility] EMR were reviewed prior to or during today's visit.  Denies acute complaint. Reviewed PMH in detail today.  Past Medical History  Diagnosis Date  . Glaucoma   . PONV (postoperative nausea and vomiting)     last anethesia in the 80s  . Osteopenia   . Family history of colon cancer     next colonoscopy due 2017    Past Surgical History  Procedure Laterality Date  . Tubal ligation    . Tympanoplasty    . Colonoscopy  '97,'02,'07,'12    2012: Polypectomy.  Mild sigmoid diverticulosis (Dr. Loreli Slot 2017  . Pars plana vitrectomy  09/30/2011    Procedure: PARS PLANA VITRECTOMY WITH 25 GAUGE;  Surgeon: Sherrie George, MD;  Location: [facility] OR;  Service: Ophthalmology;  Laterality: Right;  Macular Hole  . Gas insertion  09/30/2011    Procedure: INSERTION OF GAS;  Surgeon: Sherrie George, MD;  Location: [facility] OR;  Service: Ophthalmology;  Laterality: Right;  C3F8  . Wisdom tooth extraction      x2    Family History  Problem Relation Age of Onset  . Hypertension Mother   . Cancer Father     colon  . Hypertension Father   . Colon cancer Father 2    died from colon ca  . Stomach cancer Neg Hx     History   Social History  . Marital Status: Married    Spouse Name: N/A    Number of Children: N/A  . Years of Education: N/A   Occupational History  . Not on file.   Social History Main Topics  . Smoking status: Never Smoker   . Smokeless tobacco: Never Used  . Alcohol Use: No  . Drug Use: No  . Sexually Active: Not on file   Other Topics Concern  . Not on file   Social History  Narrative   Married, 2 children, 3 grandchildren.   Orig from [place]  Has lived in [place] since 1988.   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   No tob/alc/drugs.   Exercise: [facility] [employer]     Working on "10,000" steps a day.    Outpatient Encounter Prescriptions as of 06/29/2012  Medication Sig Dispense Refill  . b complex vitamins tablet Take 1 tablet by mouth daily.        . Calcium Carbonate-Vit D-Min (CALCIUM 1200 PO) Take 2 tablets by mouth daily.       . Cholecalciferol (VITAMIN D) 1000 UNITS capsule Take 1,000 Units by mouth daily.        Marland Kitchen levobunolol (BETAGAN) 0.5 % ophthalmic solution Place 1 drop into both eyes every morning.       . Lutein 6 MG CAPS Take 6 mg by mouth daily.       . [DISCONTINUED] brimonidine (ALPHAGAN) 0.2 % ophthalmic solution Place 1 drop into the right eye 2 (two) times daily.  5 mL    . [DISCONTINUED] gatifloxacin (ZYMAXID) 0.5 % SOLN Place 1 drop into  the right eye 4 (four) times daily.       No facility-administered encounter medications on file as of 06/29/2012.    No Known Allergies  ROS Review of Systems  Constitutional: Negative for fever and fatigue.  HENT: Negative for congestion and sore throat.   Eyes: Negative for visual disturbance.  Respiratory: Negative for cough.   Cardiovascular: Negative for chest pain.  Gastrointestinal: Negative for nausea and abdominal pain.  Genitourinary: Negative for dysuria.  Musculoskeletal: Negative for back pain and joint swelling.  Skin: Negative for rash.  Neurological: Negative for weakness and headaches.  Hematological: Negative for adenopathy.    PE; Blood pressure 103/69, pulse 67, temperature 97.8 F (36.6 C), temperature source Oral, resp. rate 14, height 5' 7.25" (1.708 m), weight 144 lb 8 oz (65.545 kg), SpO2 98.00%. Gen: Alert, well appearing.  Patient is oriented to person, place, time, and situation. ENT:   Eyes: no injection, icterus, swelling, or exudate.  EOMI, PERRLA. Nose: no drainage or turbinate edema/swelling.  No injection or focal lesion.  Mouth: lips without lesion/swelling.  Oral mucosa pink and moist.    Oropharynx without erythema, exudate, or swelling.  Neck - No masses or thyromegaly or limitation in range of motion CV: RRR, no m/r/g.   LUNGS: CTA bilat, nonlabored resps, good aeration in all lung fields. ABD: soft, NT/ND EXT: no clubbing, cyanosis, or edema.   Pertinent labs:  None today  ASSESSMENT AND PLAN:   Transfer pt:  Osteopenia Continue calcium and vit D supplementation.  Repeat bone densitometry 2-3 yrs.  Glaucoma Continue betagan ophthalmic solution and ophtho f/u.  Family history of colon cancer Next colonoscopy due 2017.   An After Visit Summary was printed and given to the patient.  Return as needed.  Needs annual CPE's.

## 2012-07-06 ENCOUNTER — Ambulatory Visit (INDEPENDENT_AMBULATORY_CARE_PROVIDER_SITE_OTHER): Payer: Medicare Other | Admitting: Ophthalmology

## 2012-07-06 DIAGNOSIS — H35349 Macular cyst, hole, or pseudohole, unspecified eye: Secondary | ICD-10-CM

## 2012-07-06 DIAGNOSIS — H43819 Vitreous degeneration, unspecified eye: Secondary | ICD-10-CM | POA: Diagnosis not present

## 2012-07-06 DIAGNOSIS — H2510 Age-related nuclear cataract, unspecified eye: Secondary | ICD-10-CM

## 2012-07-24 ENCOUNTER — Encounter: Payer: Self-pay | Admitting: Family Medicine

## 2012-07-24 NOTE — Assessment & Plan Note (Signed)
Continue calcium and vit D supplementation.  Repeat bone densitometry 2-3 yrs.

## 2012-07-24 NOTE — Assessment & Plan Note (Signed)
Next colonoscopy due 2017.

## 2012-07-24 NOTE — Assessment & Plan Note (Signed)
Continue betagan ophthalmic solution and ophtho f/u.

## 2012-08-07 DIAGNOSIS — Z130 Encounter for screening for diseases of the blood and blood-forming organs and certain disorders involving the immune mechanism: Secondary | ICD-10-CM | POA: Diagnosis not present

## 2012-08-07 DIAGNOSIS — Z124 Encounter for screening for malignant neoplasm of cervix: Secondary | ICD-10-CM | POA: Diagnosis not present

## 2012-08-07 DIAGNOSIS — Z01419 Encounter for gynecological examination (general) (routine) without abnormal findings: Secondary | ICD-10-CM | POA: Diagnosis not present

## 2012-08-09 DIAGNOSIS — H4011X0 Primary open-angle glaucoma, stage unspecified: Secondary | ICD-10-CM | POA: Diagnosis not present

## 2012-08-09 DIAGNOSIS — H35349 Macular cyst, hole, or pseudohole, unspecified eye: Secondary | ICD-10-CM | POA: Diagnosis not present

## 2012-08-09 DIAGNOSIS — H2510 Age-related nuclear cataract, unspecified eye: Secondary | ICD-10-CM | POA: Diagnosis not present

## 2012-08-23 ENCOUNTER — Other Ambulatory Visit: Payer: Self-pay

## 2012-09-03 IMAGING — CR DG FOOT COMPLETE 3+V*L*
3 series · 3 of 3 positions shown · non-contrast
Comparison: None.

CLINICAL DATA: Left foot pain for 2 weeks after running.

LEFT FOOT - COMPLETE 3+ VIEW

[t foot ap left]
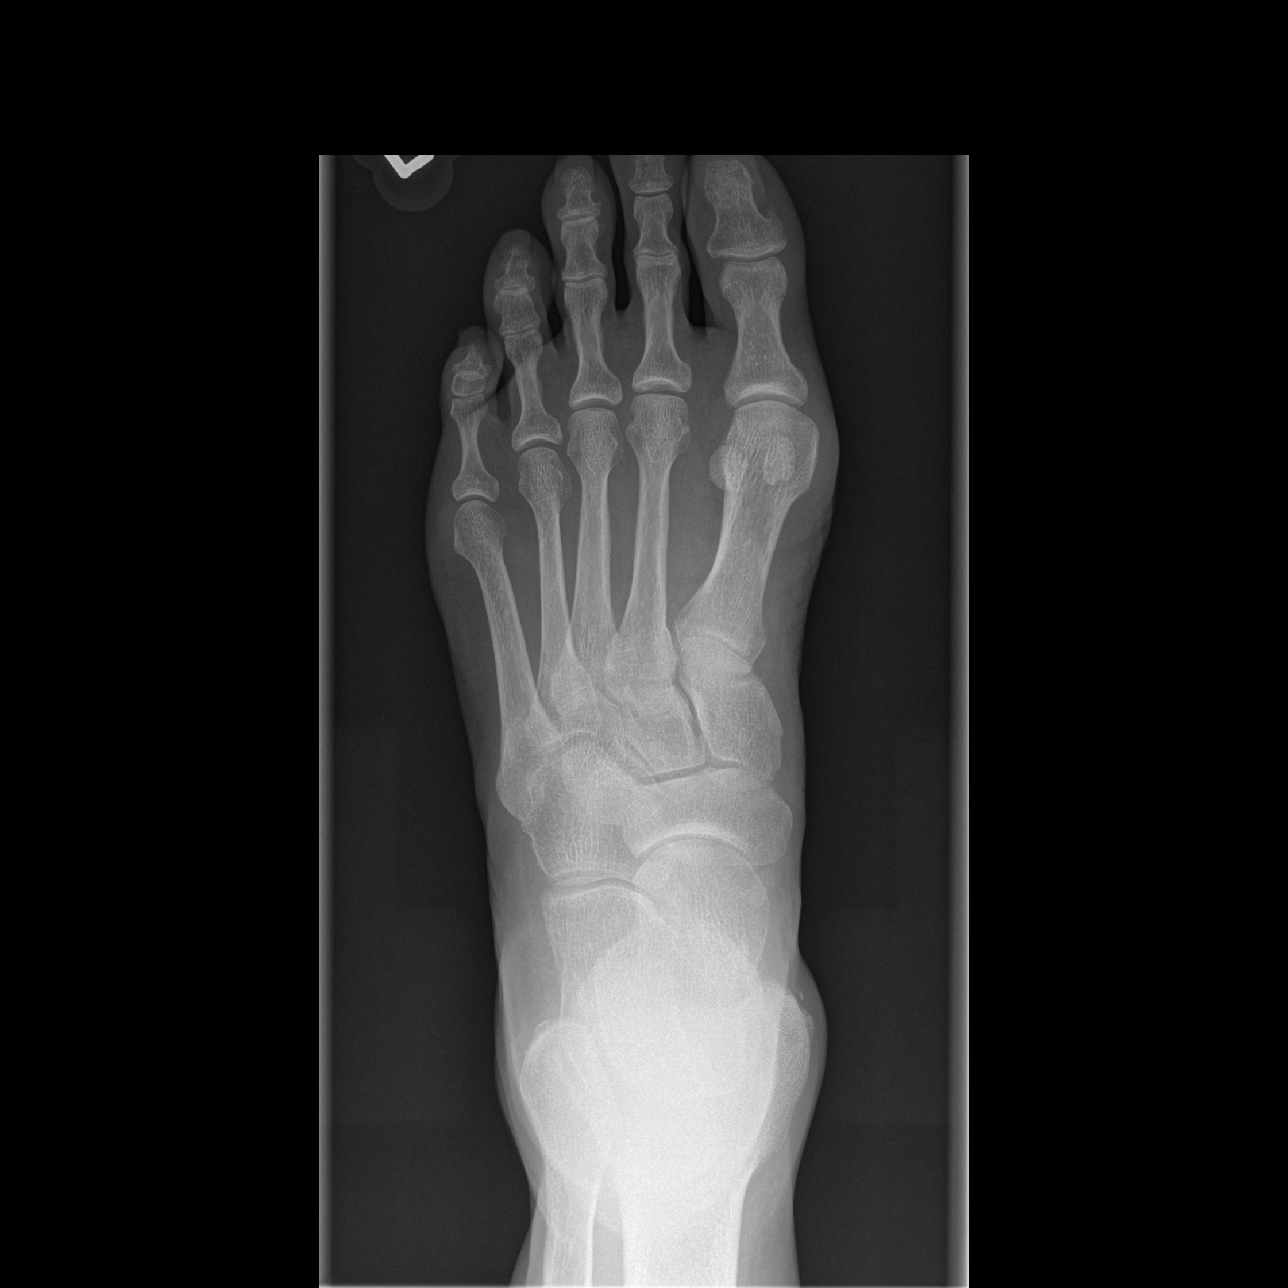

[t foot oblique left]
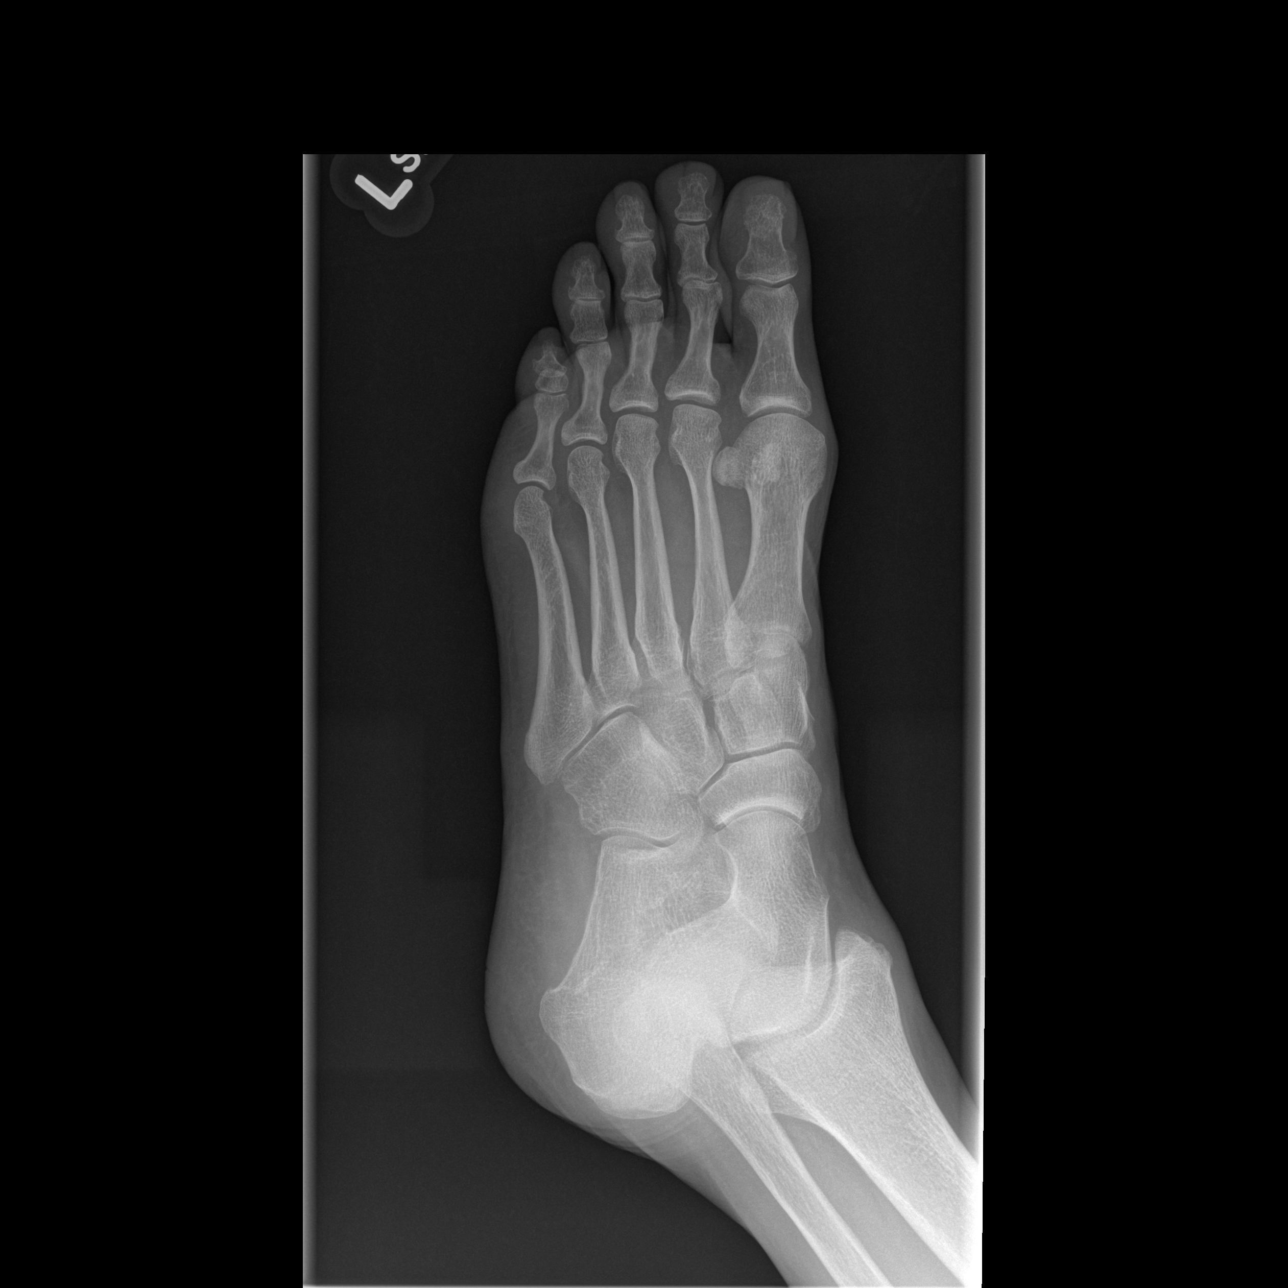

[t foot lat left]
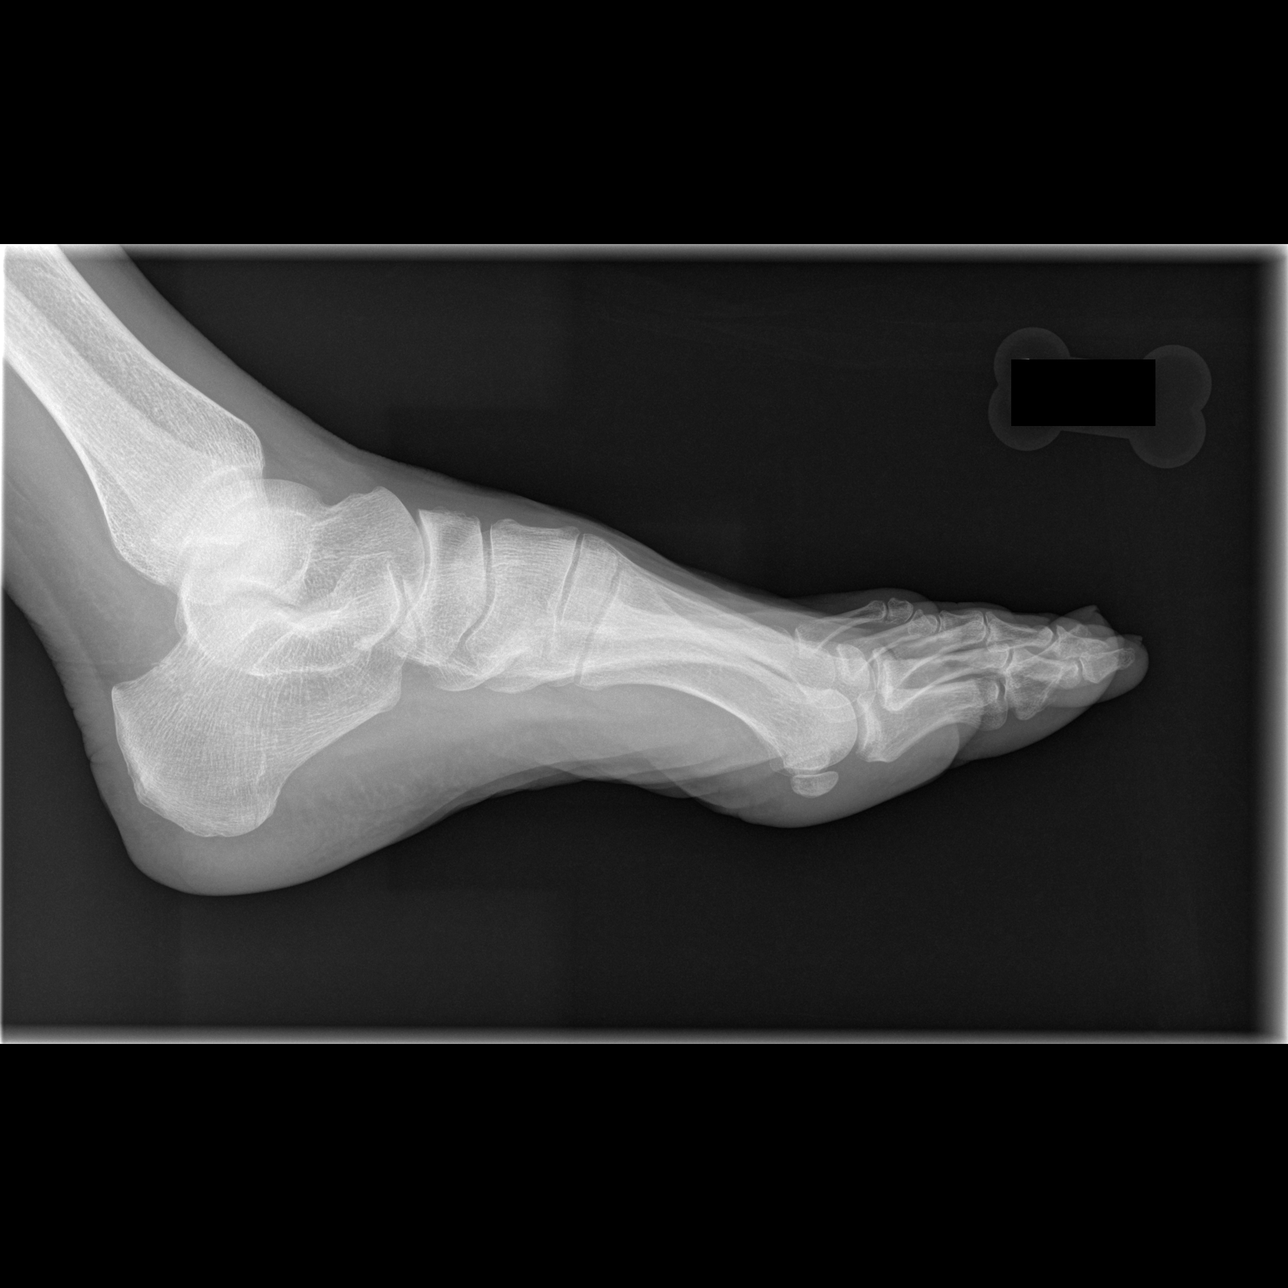

[3 of 3 positions shown; findings below may reference images not displayed]

FINDINGS: The mineralization and alignment are normal.  There is no
evidence of acute fracture or dislocation.  No soft tissue
abnormalities are identified.
IMPRESSION: No acute osseous findings demonstrated.

## 2012-09-11 DIAGNOSIS — H2510 Age-related nuclear cataract, unspecified eye: Secondary | ICD-10-CM | POA: Diagnosis not present

## 2012-11-23 ENCOUNTER — Other Ambulatory Visit: Payer: Self-pay

## 2013-01-18 LAB — HM MAMMOGRAPHY: HM MAMMO: NORMAL

## 2013-03-21 DIAGNOSIS — H40019 Open angle with borderline findings, low risk, unspecified eye: Secondary | ICD-10-CM | POA: Diagnosis not present

## 2013-03-21 DIAGNOSIS — Z961 Presence of intraocular lens: Secondary | ICD-10-CM | POA: Diagnosis not present

## 2013-03-21 DIAGNOSIS — H2510 Age-related nuclear cataract, unspecified eye: Secondary | ICD-10-CM | POA: Diagnosis not present

## 2013-03-30 ENCOUNTER — Ambulatory Visit (INDEPENDENT_AMBULATORY_CARE_PROVIDER_SITE_OTHER): Payer: Medicare Other | Admitting: Emergency Medicine

## 2013-03-30 VITALS — BP 122/76 | HR 81 | Temp 99.0°F | Resp 17 | Ht 68.5 in | Wt 145.0 lb

## 2013-03-30 DIAGNOSIS — J020 Streptococcal pharyngitis: Secondary | ICD-10-CM | POA: Diagnosis not present

## 2013-03-30 DIAGNOSIS — J029 Acute pharyngitis, unspecified: Secondary | ICD-10-CM

## 2013-03-30 LAB — POCT RAPID STREP A (OFFICE): RAPID STREP A SCREEN: POSITIVE — AB

## 2013-03-30 MED ORDER — AMOXICILLIN 875 MG PO TABS: 875.0000 mg | ORAL_TABLET | Freq: Two times a day (BID) | ORAL | Status: DC

## 2013-03-30 NOTE — Progress Notes (Signed)
Subjective:    Patient ID: Stacy White, female    DOB: [birth year], 68 y.o.   MRN: [id number]  HPI Scribed for [name] MD, the patient was seen in room 9. This chart was scribed by Denice Bors, ED scribe. Patient's care was started at 10:10 AM  HPI Comments: Hx was provided by the pt.  Stacy White is a 68 y.o. female who presents to the [facility] complaining of constant sore throat onset 2 days. Describes sore throat as worsening in severity. Reports symptoms are exacerbated by swallowing. Denies trying any alleviating factors. Denies associated fever, dysphagia, shortness of breath, congestion, cough, and generalized myalgias. Reports possible sick contacts. Reports PMHx of strep throat.  Past Medical History  Diagnosis Date  . Glaucoma   . PONV (postoperative nausea and vomiting)     last anethesia in the 80s  . Osteopenia   . Family history of colon cancer     next colonoscopy due 2017    Past Surgical History  Procedure Laterality Date  . Tubal ligation    . Tympanoplasty    . Colonoscopy  '97,'02,'07,'12    2012: Polypectomy.  Mild sigmoid diverticulosis (Dr. Vito Backers 2017  . Pars plana vitrectomy  09/30/2011    Procedure: PARS PLANA VITRECTOMY WITH 25 GAUGE;  Surgeon: Hayden Pedro, MD;  Location: [place];  Service: Ophthalmology;  Laterality: Right;  Macular Hole  . Gas insertion  09/30/2011    Procedure: INSERTION OF GAS;  Surgeon: Hayden Pedro, MD;  Location: [place];  Service: Ophthalmology;  Laterality: Right;  C3F8  . Wisdom tooth extraction      x2  . Eye surgery      Family History  Problem Relation Age of Onset  . Hypertension Mother   . Cancer Father     colon  . Hypertension Father   . Colon cancer Father 56    died from colon ca  . Stomach cancer Neg Hx     History   Social History  . Marital Status: Married    Spouse Name: N/A    Number of Children: N/A  . Years of Education: N/A   Occupational History  .  Not on file.   Social History Main Topics  . Smoking status: Never Smoker   . Smokeless tobacco: Never Used  . Alcohol Use: No  . Drug Use: No  . Sexual Activity: No   Other Topics Concern  . Not on file   Social History Narrative   Married, 2 children, 3 grandchildren.   Orig from [place].  Has lived in [place] since 1988.   Occupation: retired Oncologist business.   No tob/alc/drugs.   Exercise: [facility] [employer]     Working on "10,000" steps a day.    No Known Allergies  Patient Active Problem List   Diagnosis Date Noted  . 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 Osteopenia 07/01/2010  . Family history of colon cancer 07/01/2010  . Glaucoma 07/01/2010       Review of Systems  Constitutional: Negative for fever.  HENT: Positive for sore throat. Negative for congestion and trouble swallowing.   Respiratory: Negative for cough and shortness of breath.   Musculoskeletal: Negative for myalgias.  Psychiatric/Behavioral: Negative for confusion.  All other systems reviewed and are negative.       Objective:   Physical Exam Physical Exam  Nursing note and  vitals reviewed. Constitutional: She is oriented to person, place, and time. She appears well-developed and well-nourished. No distress.  HENT:  Head: Normocephalic and atraumatic.  Throat: Oropharynx is mildly erythematous. No oropharyngeal edema or exudates. Oral mucosa is moist.  Eyes: EOM are normal.  Neck: Neck supple. No tracheal deviation present.  Cardiovascular: Normal rate.   Pulmonary/Chest: Effort normal. No respiratory distress.  Musculoskeletal: Normal range of motion.  Neurological: She is alert and oriented to person, place, and time.  Skin: Skin is warm and dry.  Psychiatric: She has a normal mood and affect. Her behavior is normal.    COORDINATION OF CARE:  Nursing notes reviewed. Vital signs reviewed. Initial pt interview and  examination performed.   10:19 AM-Discussed work up plan with pt at bedside, which includes rapid strep screen. Pt agrees with plan.   Treatment plan initiated:Medications - No data to display   Initial diagnostic testing ordered.   Results for orders placed in visit on 03/30/13  POCT RAPID STREP A (OFFICE)      Result Value Ref Range   Rapid Strep A Screen Positive (*) Negative      Assessment & Plan:   Patient here with strep throat. We'll treat with amoxicillin twice a day continue salt water gargles at either Tylenol or ibuprofen. I personally performed the services described in this documentation, which was scribed in my presence. The recorded information has been reviewed and is accurate.

## 2013-03-30 NOTE — Patient Instructions (Signed)
Strep Throat  Strep throat is an infection of the throat caused by a bacteria named Streptococcus pyogenes. Your caregiver may call the infection streptococcal "tonsillitis" or "pharyngitis" depending on whether there are signs of inflammation in the tonsils or back of the throat. Strep throat is most common in children aged 67 15 years during the cold months of the year, but it can occur in people of any age during any season. This infection is spread from person to person (contagious) through coughing, sneezing, or other close contact.  SYMPTOMS   · Fever or chills.  · Painful, swollen, red tonsils or throat.  · Pain or difficulty when swallowing.  · White or yellow spots on the tonsils or throat.  · Swollen, tender lymph nodes or "glands" of the neck or under the jaw.  · Red rash all over the body (rare).  DIAGNOSIS   Many different infections can cause the same symptoms. A test must be done to confirm the diagnosis so the right treatment can be given. A "rapid strep test" can help your caregiver make the diagnosis in a few minutes. If this test is not available, a light swab of the infected area can be used for a throat culture test. If a throat culture test is done, results are usually available in a day or two.  TREATMENT   Strep throat is treated with antibiotic medicine.  HOME CARE INSTRUCTIONS   · Gargle with 1 tsp of salt in 1 cup of warm water, 3 4 times per day or as needed for comfort.  · Family members who also have a sore throat or fever should be tested for strep throat and treated with antibiotics if they have the strep infection.  · Make sure everyone in your household washes their hands well.  · Do not share food, drinking cups, or personal items that could cause the infection to spread to others.  · You may need to eat a soft food diet until your sore throat gets better.  · Drink enough water and fluids to keep your urine clear or pale yellow. This will help prevent dehydration.  · Get plenty of  rest.  · Stay home from school, daycare, or work until you have been on antibiotics for 24 hours.  · Only take over-the-counter or prescription medicines for pain, discomfort, or fever as directed by your caregiver.  · If antibiotics are prescribed, take them as directed. Finish them even if you start to feel better.  SEEK MEDICAL CARE IF:   · The glands in your neck continue to enlarge.  · You develop a rash, cough, or earache.  · You cough up green, yellow-brown, or bloody sputum.  · You have pain or discomfort not controlled by medicines.  · Your problems seem to be getting worse rather than better.  SEEK IMMEDIATE MEDICAL CARE IF:   · You develop any new symptoms such as vomiting, severe headache, stiff or painful neck, chest pain, shortness of breath, or trouble swallowing.  · You develop severe throat pain, drooling, or changes in your voice.  · You develop swelling of the neck, or the skin on the neck becomes red and tender.  · You have a fever.  · You develop signs of dehydration, such as fatigue, dry mouth, and decreased urination.  · You become increasingly sleepy, or you cannot wake up completely.  Document Released: 01/02/2000 Document Revised: 12/22/2011 Document Reviewed: 03/05/2010  Exit[employer]® Patient Information ©2014 [employer].

## 2013-04-04 ENCOUNTER — Ambulatory Visit (INDEPENDENT_AMBULATORY_CARE_PROVIDER_SITE_OTHER): Payer: Medicare Other | Admitting: Ophthalmology

## 2013-04-19 ENCOUNTER — Ambulatory Visit (INDEPENDENT_AMBULATORY_CARE_PROVIDER_SITE_OTHER): Payer: Medicare Other | Admitting: Ophthalmology

## 2013-05-02 ENCOUNTER — Ambulatory Visit (INDEPENDENT_AMBULATORY_CARE_PROVIDER_SITE_OTHER): Payer: Medicare Other | Admitting: Ophthalmology

## 2013-05-02 DIAGNOSIS — H43819 Vitreous degeneration, unspecified eye: Secondary | ICD-10-CM

## 2013-05-02 DIAGNOSIS — H35349 Macular cyst, hole, or pseudohole, unspecified eye: Secondary | ICD-10-CM

## 2013-05-02 DIAGNOSIS — H2510 Age-related nuclear cataract, unspecified eye: Secondary | ICD-10-CM | POA: Diagnosis not present

## 2013-08-07 DIAGNOSIS — Z1231 Encounter for screening mammogram for malignant neoplasm of breast: Secondary | ICD-10-CM | POA: Diagnosis not present

## 2013-08-17 DIAGNOSIS — N6009 Solitary cyst of unspecified breast: Secondary | ICD-10-CM | POA: Diagnosis not present

## 2013-09-06 DIAGNOSIS — Z130 Encounter for screening for diseases of the blood and blood-forming organs and certain disorders involving the immune mechanism: Secondary | ICD-10-CM | POA: Diagnosis not present

## 2013-09-06 DIAGNOSIS — N952 Postmenopausal atrophic vaginitis: Secondary | ICD-10-CM | POA: Diagnosis not present

## 2013-09-06 DIAGNOSIS — Z01419 Encounter for gynecological examination (general) (routine) without abnormal findings: Secondary | ICD-10-CM | POA: Diagnosis not present

## 2013-09-21 DIAGNOSIS — Z961 Presence of intraocular lens: Secondary | ICD-10-CM | POA: Diagnosis not present

## 2013-09-21 DIAGNOSIS — H40019 Open angle with borderline findings, low risk, unspecified eye: Secondary | ICD-10-CM | POA: Diagnosis not present

## 2013-09-21 DIAGNOSIS — H2510 Age-related nuclear cataract, unspecified eye: Secondary | ICD-10-CM | POA: Diagnosis not present

## 2013-10-02 DIAGNOSIS — H6120 Impacted cerumen, unspecified ear: Secondary | ICD-10-CM | POA: Diagnosis not present

## 2013-10-02 DIAGNOSIS — H9209 Otalgia, unspecified ear: Secondary | ICD-10-CM | POA: Diagnosis not present

## 2013-10-15 DIAGNOSIS — N390 Urinary tract infection, site not specified: Secondary | ICD-10-CM | POA: Diagnosis not present

## 2014-02-10 DIAGNOSIS — N390 Urinary tract infection, site not specified: Secondary | ICD-10-CM | POA: Diagnosis not present

## 2014-03-22 DIAGNOSIS — H40013 Open angle with borderline findings, low risk, bilateral: Secondary | ICD-10-CM | POA: Diagnosis not present

## 2014-03-22 DIAGNOSIS — H2512 Age-related nuclear cataract, left eye: Secondary | ICD-10-CM | POA: Diagnosis not present

## 2014-03-22 DIAGNOSIS — Z961 Presence of intraocular lens: Secondary | ICD-10-CM | POA: Diagnosis not present

## 2014-05-03 ENCOUNTER — Ambulatory Visit (INDEPENDENT_AMBULATORY_CARE_PROVIDER_SITE_OTHER): Payer: Medicare Other | Admitting: Ophthalmology

## 2014-05-03 DIAGNOSIS — H35341 Macular cyst, hole, or pseudohole, right eye: Secondary | ICD-10-CM

## 2014-05-03 DIAGNOSIS — H43812 Vitreous degeneration, left eye: Secondary | ICD-10-CM | POA: Diagnosis not present

## 2014-05-23 DIAGNOSIS — R399 Unspecified symptoms and signs involving the genitourinary system: Secondary | ICD-10-CM | POA: Diagnosis not present

## 2014-07-15 ENCOUNTER — Other Ambulatory Visit: Payer: Self-pay

## 2014-07-15 ENCOUNTER — Telehealth: Payer: Self-pay | Admitting: *Deleted

## 2014-07-15 ENCOUNTER — Encounter: Payer: Self-pay | Admitting: *Deleted

## 2014-07-15 NOTE — Telephone Encounter (Signed)
Pre-Visit Call completed with patient and chart updated.   Pre-Visit Info documented in Specialty Comments under [employer].    

## 2014-07-16 ENCOUNTER — Ambulatory Visit (INDEPENDENT_AMBULATORY_CARE_PROVIDER_SITE_OTHER): Payer: Medicare Other | Admitting: Medical

## 2014-07-16 ENCOUNTER — Encounter: Payer: Self-pay | Admitting: Medical

## 2014-07-16 VITALS — BP 127/76 | HR 76 | Temp 98.6°F | Ht 68.5 in | Wt 149.6 lb

## 2014-07-16 DIAGNOSIS — E7800 Pure hypercholesterolemia, unspecified: Secondary | ICD-10-CM | POA: Insufficient documentation

## 2014-07-16 DIAGNOSIS — M8580 Other specified disorders of bone density and structure, unspecified site: Secondary | ICD-10-CM

## 2014-07-16 DIAGNOSIS — E785 Hyperlipidemia, unspecified: Secondary | ICD-10-CM | POA: Insufficient documentation

## 2014-07-16 DIAGNOSIS — H409 Unspecified glaucoma: Secondary | ICD-10-CM | POA: Diagnosis not present

## 2014-07-16 DIAGNOSIS — R944 Abnormal results of kidney function studies: Secondary | ICD-10-CM

## 2014-07-16 NOTE — Assessment & Plan Note (Addendum)
Pt will continue vitamin D otc. Plan to get dexascan on future visit.

## 2014-07-16 NOTE — Progress Notes (Signed)
Subjective:    Patient ID: Stacy White, female    DOB: May 14, 1945, 69 y.o.   MRN: [id number]  HPI   I have reviewed pt PMH, PSH, FH, Social History and Surgical History  Pt states glaucoma- Pt opthalmolgist. Not convinced she meets all criteria. She is being followed closely.  Cataract surgery- Hx of surgery rt eye in the past.  Pt mom recenlty died at age 36. She had copd and chf.  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 Some cardio 3 times a week, diet sodas one time a week, diet healthy, married- 2 daughter. 3 grandchildren.  Pt has history of colon polyps. Pt see gynecologist. Mammograms have been negative. Pt told no longer papsmears are needed.   Pt has not had labs in some time.  By lab review Hx of mild low gfr. Hx of mild low hyperlipidemia Pt does not that over past year 3 uti since fall of 2015. 2 uti this year. Studies done at various at [facility].  Pt does not take flu shots. She just shingles vaccine at [facility].      Review of Systems  Constitutional: Negative for fever, chills, diaphoresis, activity change and fatigue.  Respiratory: Negative for cough, chest tightness and shortness of breath.   Cardiovascular: Negative for chest pain, palpitations and leg swelling.  Gastrointestinal: Negative for nausea, vomiting and abdominal pain.  Genitourinary: Negative for dysuria, urgency, frequency, hematuria, flank pain and genital sores.  Musculoskeletal: Negative for back pain, neck pain and neck stiffness.  Neurological: Negative for dizziness, tremors, seizures, syncope, facial asymmetry, speech difficulty, weakness, light-headedness, numbness and headaches.  Psychiatric/Behavioral: Negative for behavioral problems, confusion and agitation. The patient is not nervous/anxious.      Past Medical History  Diagnosis Date  . Glaucoma   . PONV (postoperative nausea and vomiting)     last anethesia in the 80s  . Osteopenia   . Family  history of colon cancer     next colonoscopy due 2017    History   Social History  . Marital Status: Married    Spouse Name: N/A  . Number of Children: N/A  . Years of Education: N/A   Occupational History  . Not on file.   Social History Main Topics  . Smoking status: Never Smoker   . Smokeless tobacco: Never Used  . Alcohol Use: No  . Drug Use: No  . Sexual Activity: No   Other Topics Concern  . Not on file   Social History Narrative   Married, 2 children, 3 grandchildren.   Orig from [place].  Has lived in [place] since 1988.   Occupation: retired Oncologist business.   No tob/alc/drugs.   Exercise: strength training and [facility] [employer]     Working on "10,000" steps a day.    Past Surgical History  Procedure Laterality Date  . Tubal ligation    . Tympanoplasty    . Colonoscopy  '97,'02,'07,'12    2012: Polypectomy.  Mild sigmoid diverticulosis (Dr. Vito Backers 2017  . Pars plana vitrectomy  09/30/2011    Procedure: PARS PLANA VITRECTOMY WITH 25 GAUGE;  Surgeon: Hayden Pedro, MD;  Location: [place];  Service: Ophthalmology;  Laterality: Right;  Macular Hole  . Gas insertion  09/30/2011    Procedure: INSERTION OF GAS;  Surgeon: Hayden Pedro, MD;  Location: [facility];  Service: Ophthalmology;  Laterality: Right;  C3F8  .  Wisdom tooth extraction      x2  . Eye surgery      Family History  Problem Relation Age of Onset  . Hypertension Mother   . COPD Mother   . Cancer Father     colon  . Hypertension Father   . Colon cancer Father 25    died from colon ca  . Stomach cancer Neg Hx     No Known Allergies  Current Outpatient Prescriptions on File Prior to Visit  Medication Sig Dispense Refill  . b complex vitamins tablet Take 1 tablet by mouth daily.      . Calcium Carbonate-Vit D-Min (CALCIUM 1200 PO) Take 2 tablets by mouth daily.     . Cholecalciferol (VITAMIN D) 1000 UNITS capsule Take 1,000 Units by  mouth daily.      . Lutein 6 MG CAPS Take 6 mg by mouth daily.      No current facility-administered medications on file prior to visit.    BP 127/76 mmHg  Pulse 76  Temp(Src) 98.6 F (37 C) (Oral)  Ht 5' 8.5" (1.74 m)  Wt 149 lb 9.6 oz (67.858 kg)  BMI 22.41 kg/m2  SpO2 98%       Objective:   Physical Exam  General Mental Status- Alert. General Appearance- Not in acute distress.   Skin General: Color- Normal Color. Moisture- Normal Moisture.  Neck Carotid Arteries- Normal color. Moisture- Normal Moisture. No carotid bruits. No JVD.  Chest and Lung Exam Auscultation: Breath Sounds:-Normal. CTA.  Cardiovascular Auscultation:Rythm- Regular, rate and rhythm. Murmurs & Other Heart Sounds:Auscultation of the heart reveals- No Murmurs.  Abdomen Inspection:-Inspeection Normal. Palpation/Percussion:Note:No mass. Palpation and Percussion of the abdomen reveal- Non Tender, Non Distended + BS, no rebound or guarding.    Neurologic Cranial Nerve exam:- CN III-XII intact(No nystagmus), symmetric smile. Strength:- 5/5 equal and symmetric strength both upper and lower extremities.  Back- no cva tenderness      Assessment & Plan:

## 2014-07-16 NOTE — Assessment & Plan Note (Signed)
Followed by eye doctor. Presently this diagnosis is in question.

## 2014-07-16 NOTE — Progress Notes (Signed)
Pre visit review using our clinic review tool, if applicable. No additional management support is needed unless otherwise documented below in the visit note. 

## 2014-07-16 NOTE — Patient Instructions (Addendum)
Osteopenia Pt will continue vitamin D otc. Plan to get dexascan on future visit.  Decreased GFR minmal faint decrease in past. Recheck on cmp(future lab)  Elevated LDL cholesterol level Minimal elevation in the past. Check lipid panel(future lab put in.)  Glaucoma Followed by eye doctor. Presently this diagnosis is in question.    Folllow up date to be determined after labs back.  Continue healty diet and exercise.

## 2014-07-16 NOTE — Assessment & Plan Note (Signed)
Minimal elevation in the past. Check lipid panel(future lab put in.)

## 2014-07-16 NOTE — Assessment & Plan Note (Signed)
minmal faint decrease in past. Recheck on cmp(future lab)

## 2014-08-20 DIAGNOSIS — Z1231 Encounter for screening mammogram for malignant neoplasm of breast: Secondary | ICD-10-CM | POA: Diagnosis not present

## 2014-08-20 DIAGNOSIS — Z87310 Personal history of (healed) osteoporosis fracture: Secondary | ICD-10-CM | POA: Diagnosis not present

## 2014-08-29 ENCOUNTER — Other Ambulatory Visit (INDEPENDENT_AMBULATORY_CARE_PROVIDER_SITE_OTHER): Payer: Medicare Other

## 2014-08-29 DIAGNOSIS — E7800 Pure hypercholesterolemia, unspecified: Secondary | ICD-10-CM

## 2014-08-29 DIAGNOSIS — R944 Abnormal results of kidney function studies: Secondary | ICD-10-CM

## 2014-08-29 LAB — CBC WITH DIFFERENTIAL/PLATELET
BASOS PCT: 0.7 % (ref 0.0–3.0)
Basophils Absolute: 0 10*3/uL (ref 0.0–0.1)
Eosinophils Absolute: 0.2 10*3/uL (ref 0.0–0.7)
Eosinophils Relative: 3.4 % (ref 0.0–5.0)
HCT: 39.5 % (ref 36.0–46.0)
Hemoglobin: 13.5 g/dL (ref 12.0–15.0)
LYMPHS PCT: 37.2 % (ref 12.0–46.0)
Lymphs Abs: 1.7 10*3/uL (ref 0.7–4.0)
MCHC: 34.2 g/dL (ref 30.0–36.0)
MCV: 93.1 fl (ref 78.0–100.0)
MONO ABS: 0.6 10*3/uL (ref 0.1–1.0)
Monocytes Relative: 12.2 % — ABNORMAL HIGH (ref 3.0–12.0)
NEUTROS PCT: 46.5 % (ref 43.0–77.0)
Neutro Abs: 2.1 10*3/uL (ref 1.4–7.7)
Platelets: 193 10*3/uL (ref 150.0–400.0)
RBC: 4.24 Mil/uL (ref 3.87–5.11)
RDW: 12.7 % (ref 11.5–15.5)
WBC: 4.6 10*3/uL (ref 4.0–10.5)

## 2014-08-29 LAB — COMPREHENSIVE METABOLIC PANEL
ALK PHOS: 79 U/L (ref 39–117)
ALT: 22 U/L (ref 0–35)
AST: 29 U/L (ref 0–37)
Albumin: 4.1 g/dL (ref 3.5–5.2)
BILIRUBIN TOTAL: 1.4 mg/dL — AB (ref 0.2–1.2)
BUN: 22 mg/dL (ref 6–23)
CO2: 28 mEq/L (ref 19–32)
CREATININE: 1.09 mg/dL (ref 0.40–1.20)
Calcium: 9.3 mg/dL (ref 8.4–10.5)
Chloride: 106 mEq/L (ref 96–112)
GFR: 53.1 mL/min — ABNORMAL LOW (ref >60.00)
Glucose, Bld: 97 mg/dL (ref 70–99)
Potassium: 4.2 mEq/L (ref 3.5–5.1)
SODIUM: 141 meq/L (ref 135–145)
TOTAL PROTEIN: 7.2 g/dL (ref 6.0–8.3)

## 2014-08-29 LAB — LIPID PANEL
CHOL/HDL RATIO: 3
Cholesterol: 190 mg/dL (ref 0–200)
HDL: 55.2 mg/dL (ref >39.00)
LDL CALC: 113 mg/dL — AB (ref 0–99)
NONHDL: 134.86
Triglycerides: 108 mg/dL (ref 0.0–149.0)
VLDL: 21.6 mg/dL (ref 0.0–40.0)

## 2014-08-30 ENCOUNTER — Other Ambulatory Visit: Payer: Medicare Other

## 2014-09-07 ENCOUNTER — Telehealth: Payer: Self-pay | Admitting: Medical

## 2014-09-07 NOTE — Telephone Encounter (Signed)
Pt has normal spine density on bone scan. Lt femur density just at edge of normal/osteopenia. Rt femur density. Little worse density than left. Close to normal but in upper limit of osteopenia. No osteoporosis found. Continue vitamin d and calcium Repeat bone density in 2 years.

## 2014-09-09 ENCOUNTER — Encounter: Payer: Self-pay | Admitting: Medical

## 2014-09-09 NOTE — Telephone Encounter (Signed)
Left message for patient to return my call regarding bone scan and density results.

## 2014-09-10 NOTE — Telephone Encounter (Signed)
Left second message for patient to return call.

## 2014-09-11 ENCOUNTER — Encounter: Payer: Self-pay | Admitting: Medical

## 2014-09-11 NOTE — Progress Notes (Signed)
Letter mailed to patient with results. Left messages for patient x 2 no call back.

## 2014-09-27 DIAGNOSIS — H4011X1 Primary open-angle glaucoma, mild stage: Secondary | ICD-10-CM | POA: Diagnosis not present

## 2014-09-27 DIAGNOSIS — H2512 Age-related nuclear cataract, left eye: Secondary | ICD-10-CM | POA: Diagnosis not present

## 2014-09-27 DIAGNOSIS — Z961 Presence of intraocular lens: Secondary | ICD-10-CM | POA: Diagnosis not present

## 2014-11-08 DIAGNOSIS — H401131 Primary open-angle glaucoma, bilateral, mild stage: Secondary | ICD-10-CM | POA: Diagnosis not present

## 2015-02-17 ENCOUNTER — Telehealth: Payer: Self-pay | Admitting: Medical

## 2015-02-17 NOTE — Telephone Encounter (Signed)
LM for pt to call and schedule flu shot or update records. °

## 2015-02-17 NOTE — Telephone Encounter (Signed)
Pt does not take the flu shot  °

## 2015-02-17 NOTE — Telephone Encounter (Signed)
Documented 02/17/15 that pt declined.

## 2015-02-19 ENCOUNTER — Telehealth: Payer: Self-pay

## 2015-02-19 NOTE — Telephone Encounter (Signed)
Left message for patient to call to scheduled awv

## 2015-02-26 ENCOUNTER — Ambulatory Visit: Payer: Medicare Other | Admitting: Medical

## 2015-02-26 DIAGNOSIS — N390 Urinary tract infection, site not specified: Secondary | ICD-10-CM | POA: Diagnosis not present

## 2015-04-22 DIAGNOSIS — H2512 Age-related nuclear cataract, left eye: Secondary | ICD-10-CM | POA: Diagnosis not present

## 2015-04-22 DIAGNOSIS — Z961 Presence of intraocular lens: Secondary | ICD-10-CM | POA: Diagnosis not present

## 2015-04-22 DIAGNOSIS — H401131 Primary open-angle glaucoma, bilateral, mild stage: Secondary | ICD-10-CM | POA: Diagnosis not present

## 2015-05-01 DIAGNOSIS — H401131 Primary open-angle glaucoma, bilateral, mild stage: Secondary | ICD-10-CM | POA: Diagnosis not present

## 2015-05-05 ENCOUNTER — Ambulatory Visit (INDEPENDENT_AMBULATORY_CARE_PROVIDER_SITE_OTHER): Payer: Medicare Other | Admitting: Ophthalmology

## 2015-05-05 DIAGNOSIS — H2512 Age-related nuclear cataract, left eye: Secondary | ICD-10-CM

## 2015-05-05 DIAGNOSIS — H43812 Vitreous degeneration, left eye: Secondary | ICD-10-CM | POA: Diagnosis not present

## 2015-05-05 DIAGNOSIS — H35341 Macular cyst, hole, or pseudohole, right eye: Secondary | ICD-10-CM | POA: Diagnosis not present

## 2015-05-22 DIAGNOSIS — H401131 Primary open-angle glaucoma, bilateral, mild stage: Secondary | ICD-10-CM | POA: Diagnosis not present

## 2015-07-15 ENCOUNTER — Encounter: Payer: Self-pay | Admitting: Medical

## 2015-07-15 ENCOUNTER — Ambulatory Visit (INDEPENDENT_AMBULATORY_CARE_PROVIDER_SITE_OTHER): Payer: Medicare Other | Admitting: Medical

## 2015-07-15 VITALS — BP 110/74 | HR 84 | Temp 98.1°F | Ht 68.5 in | Wt 149.4 lb

## 2015-07-15 DIAGNOSIS — H109 Unspecified conjunctivitis: Secondary | ICD-10-CM | POA: Diagnosis not present

## 2015-07-15 DIAGNOSIS — R067 Sneezing: Secondary | ICD-10-CM | POA: Diagnosis not present

## 2015-07-15 DIAGNOSIS — H6692 Otitis media, unspecified, left ear: Secondary | ICD-10-CM | POA: Diagnosis not present

## 2015-07-15 DIAGNOSIS — R0981 Nasal congestion: Secondary | ICD-10-CM | POA: Diagnosis not present

## 2015-07-15 MED ORDER — BENZONATATE 100 MG PO CAPS: 100.0000 mg | ORAL_CAPSULE | Freq: Three times a day (TID) | ORAL | Status: DC | PRN

## 2015-07-15 MED ORDER — AZITHROMYCIN 250 MG PO TABS: ORAL_TABLET | ORAL | Status: DC

## 2015-07-15 MED ORDER — TOBRAMYCIN 0.3 % OP SOLN: OPHTHALMIC | Status: DC

## 2015-07-15 MED ORDER — FLUTICASONE PROPIONATE 50 MCG/ACT NA SUSP: 2.0000 | Freq: Every day | NASAL | Status: DC

## 2015-07-15 NOTE — Progress Notes (Signed)
Pre visit review using our clinic review tool, if applicable. No additional management support is needed unless otherwise documented below in the visit note. 

## 2015-07-15 NOTE — Patient Instructions (Addendum)
For your recent nasal congestion and sneezing will rx flonase nasal spray.  For your cough rx benzonatate.  On exam your left TM looks moderate red(probable early infection)  and by exam your throat does look red as well. Will go ahead and prescribe azithromycin.  For your rt eye I am prescribing tobrex eye solution.    Follow up in 7-10 days or as needed

## 2015-07-15 NOTE — Progress Notes (Signed)
Subjective:    Patient ID: Stacy White, female    DOB: 1945/05/10, 70 y.o.   MRN: [id number]  HPI  Pt in for some nasal congestion type symptoms. Started last wed. Pt daughter was sick with URI type symptoms per pt. Pt shared drink with daughter. No sinus pressure. When she coughs can feel mucous come up. Past Thursday hoarse voice.   Pt cough does interrupt sleep some.  2 morning eye matting last 2 mornings.  Review of Systems  Constitutional: Positive for fatigue. Negative for fever and chills.  HENT: Positive for congestion and sneezing. Negative for ear pain, sinus pressure and sore throat.   Respiratory: Positive for cough. Negative for chest tightness, shortness of breath and wheezing.   Cardiovascular: Negative for chest pain and palpitations.  Gastrointestinal: Negative for abdominal pain.  Musculoskeletal: Negative for back pain.  Neurological: Negative for dizziness and light-headedness.  Hematological: Negative for adenopathy. Does not bruise/bleed easily.  Psychiatric/Behavioral: Negative for behavioral problems and confusion.    Past Medical History  Diagnosis Date  . Glaucoma   . PONV (postoperative nausea and vomiting)     last anethesia in the 80s  . Osteopenia   . Family history of colon cancer     next colonoscopy due 2017     Social History   Social History  . Marital Status: Married    Spouse Name: N/A  . Number of Children: N/A  . Years of Education: N/A   Occupational History  . Not on file.   Social History Main Topics  . Smoking status: Never Smoker   . Smokeless tobacco: Never Used  . Alcohol Use: No  . Drug Use: No  . Sexual Activity: No   Other Topics Concern  . Not on file   Social History Narrative   Married, 2 children, 3 grandchildren.   Orig from [place].  Has lived in [place] since 1988.   Occupation: retired Oncologist business.   No tob/alc/drugs.   Exercise: strength training and cardio a few  [facility] [employer].     Working on "10,000" steps a day.    Past Surgical History  Procedure Laterality Date  . Tubal ligation    . Tympanoplasty    . Colonoscopy  '97,'02,'07,'12    2012: Polypectomy.  Mild sigmoid diverticulosis (Dr. Vito Backers 2017  . Pars plana vitrectomy  09/30/2011    Procedure: PARS PLANA VITRECTOMY WITH 25 GAUGE;  Surgeon: Hayden Pedro, MD;  Location: [place];  Service: Ophthalmology;  Laterality: Right;  Macular Hole  . Gas insertion  09/30/2011    Procedure: INSERTION OF GAS;  Surgeon: Hayden Pedro, MD;  Location: [facility];  Service: Ophthalmology;  Laterality: Right;  C3F8  . Wisdom tooth extraction      x2  . Eye surgery      Family History  Problem Relation Age of Onset  . Hypertension Mother   . COPD Mother   . Cancer Father     colon  . Hypertension Father   . Colon cancer Father 42    died from colon ca  . Stomach cancer Neg Hx     No Known Allergies  Current Outpatient Prescriptions on File Prior to Visit  Medication Sig Dispense Refill  . b complex vitamins tablet Take 1 tablet by mouth daily.      . Calcium Carbonate-Vit D-Min (CALCIUM 1200 PO) Take 2 tablets by mouth daily.     Marland Kitchen  Cholecalciferol (VITAMIN D) 1000 UNITS capsule Take 1,000 Units by mouth daily.      . Lutein 6 MG CAPS Take 6 mg by mouth daily.      No current facility-administered medications on file prior to visit.    BP 110/74 mmHg  Pulse 84  Temp(Src) 98.1 F (36.7 C) (Oral)  Ht 5' 8.5" (1.74 m)  Wt 149 lb 6.4 oz (67.767 kg)  BMI 22.38 kg/m2  SpO2 96%       Objective:   Physical Exam   General  Mental Status - Alert. General Appearance - Well groomed. Not in acute distress.    Skin Rashes- No Rashes.  HEENT Head- Normal. Ear Auditory Canal - Left- Normal. Right - Normal.Tympanic Membrane- Left- Normal. Right- Normal. Eye Sclera/Conjunctiva- Left- Normal. Right- mild pink conjunctiva. No matting presently. Nose & Sinuses Nasal Mucosa-  Left-  Boggy and Congested. Right-  Boggy and  Congested.Bilateral maxillary and frontal sinus pressure. Mouth & Throat Lips: Upper Lip- Normal: no dryness, cracking, pallor, cyanosis, or vesicular eruption. Lower Lip-Normal: no dryness, cracking, pallor, cyanosis or vesicular eruption. Buccal Mucosa- Bilateral- No Aphthous ulcers. Oropharynx- No Discharge or Erythema. +pnd Tonsils: Characteristics- Bilateral- mild  Erythema and mild  Congestion. Size/Enlargement- Bilateral- No enlargement. Discharge- bilateral-None.  Neck Neck- Supple. No Masses.   Chest and Lung Exam Auscultation: Breath Sounds:-Clear even and unlabored.  Cardiovascular Auscultation:Rythm- Regular, rate and rhythm. Murmurs & Other Heart Sounds:Ausculatation of the heart reveal- No Murmurs.  Lymphatic Head & Neck General Head & Neck Lymphatics: Bilateral: Description- No Localized lymphadenopathy.        Assessment & Plan:  For your recent nasal congestion and sneezing will rx flonase nasal spray.  For your cough rx benzonatate.  On exam your left TM looks moderate red(probable early infection)  and by exam your throat does look red as well. Will White ahead and prescribe azithromycin.  For your rt eye I am prescribing tobrex eye solution.    Follow up in 7-10 days or as needed

## 2015-08-05 DIAGNOSIS — Z961 Presence of intraocular lens: Secondary | ICD-10-CM | POA: Diagnosis not present

## 2015-08-05 DIAGNOSIS — H401131 Primary open-angle glaucoma, bilateral, mild stage: Secondary | ICD-10-CM | POA: Diagnosis not present

## 2015-08-05 DIAGNOSIS — H2512 Age-related nuclear cataract, left eye: Secondary | ICD-10-CM | POA: Diagnosis not present

## 2015-08-25 ENCOUNTER — Encounter: Payer: Self-pay | Admitting: Internal Medicine

## 2015-09-01 ENCOUNTER — Encounter: Payer: Self-pay | Admitting: Medical

## 2015-09-01 DIAGNOSIS — Z1231 Encounter for screening mammogram for malignant neoplasm of breast: Secondary | ICD-10-CM | POA: Diagnosis not present

## 2015-09-04 DIAGNOSIS — N6001 Solitary cyst of right breast: Secondary | ICD-10-CM | POA: Diagnosis not present

## 2015-09-22 ENCOUNTER — Telehealth: Payer: Self-pay | Admitting: Medical

## 2015-09-22 DIAGNOSIS — N6310 Unspecified lump in the right breast, unspecified quadrant: Secondary | ICD-10-CM

## 2015-09-22 NOTE — Telephone Encounter (Signed)
Pt had mamomogram. Rt breast mass found. [facility] sent report stating US was necessary. So I put in order for [place]. Has that been done. Would you call [facility] and find out if [place] already done?

## 2015-09-23 ENCOUNTER — Telehealth: Payer: Self-pay | Admitting: Medical

## 2015-09-23 DIAGNOSIS — Z6823 Body mass index (BMI) 23.0-23.9, adult: Secondary | ICD-10-CM | POA: Diagnosis not present

## 2015-09-23 DIAGNOSIS — Z124 Encounter for screening for malignant neoplasm of cervix: Secondary | ICD-10-CM | POA: Diagnosis not present

## 2015-09-23 NOTE — Telephone Encounter (Signed)
Fax received.  Report numbered and placed in provider's red folder for review.

## 2015-09-23 NOTE — Telephone Encounter (Signed)
Thanks for looking into [place] report.

## 2015-09-23 NOTE — Telephone Encounter (Signed)
Called [facility] and spoke to [place].  She says that patient completed the ultrasound on 09/04/15.  She says report had been faxed, but that she's re-faxing it now.  Awaiting fax.

## 2015-09-25 ENCOUNTER — Telehealth: Payer: Self-pay | Admitting: Medical

## 2015-09-25 NOTE — Telephone Encounter (Signed)
Reviewed [place] of pt breast. 5 mm simple cyst seen.

## 2015-10-06 ENCOUNTER — Encounter: Payer: Self-pay | Admitting: Medical

## 2015-10-20 ENCOUNTER — Ambulatory Visit (AMBULATORY_SURGERY_CENTER): Payer: Self-pay

## 2015-10-20 VITALS — Ht 67.0 in | Wt 151.0 lb

## 2015-10-20 DIAGNOSIS — Z860100 Personal history of colon polyps, unspecified: Secondary | ICD-10-CM

## 2015-10-20 MED ORDER — SUPREP BOWEL PREP KIT 17.5-3.13-1.6 GM/177ML PO SOLN: 1.0000 | Freq: Once | ORAL | 0 refills | Status: AC

## 2015-10-20 NOTE — Progress Notes (Signed)
No allergies to eggs or soy No past problems with anesthesia No diet meds No home oxygen  Declined emmi 

## 2015-11-03 ENCOUNTER — Encounter: Payer: Medicare Other | Admitting: Internal Medicine

## 2015-11-25 ENCOUNTER — Ambulatory Visit (AMBULATORY_SURGERY_CENTER): Payer: Medicare Other | Admitting: Internal Medicine

## 2015-11-25 ENCOUNTER — Encounter: Payer: Self-pay | Admitting: Internal Medicine

## 2015-11-25 VITALS — BP 131/75 | HR 77 | Temp 96.0°F | Resp 13 | Ht 68.0 in | Wt 149.0 lb

## 2015-11-25 DIAGNOSIS — Z860100 Personal history of colon polyps, unspecified: Secondary | ICD-10-CM

## 2015-11-25 DIAGNOSIS — Z800 Family history of malignant neoplasm of digestive organs: Secondary | ICD-10-CM

## 2015-11-25 DIAGNOSIS — Z1212 Encounter for screening for malignant neoplasm of rectum: Secondary | ICD-10-CM | POA: Diagnosis not present

## 2015-11-25 DIAGNOSIS — K635 Polyp of colon: Secondary | ICD-10-CM

## 2015-11-25 DIAGNOSIS — Z1211 Encounter for screening for malignant neoplasm of colon: Secondary | ICD-10-CM

## 2015-11-25 DIAGNOSIS — D125 Benign neoplasm of sigmoid colon: Secondary | ICD-10-CM

## 2015-11-25 MED ORDER — SODIUM CHLORIDE 0.9 % IV SOLN: 500.0000 mL | INTRAVENOUS | Status: DC

## 2015-11-25 NOTE — Op Note (Signed)
[place] Patient Name: Stacy White Procedure Date: 11/25/2015 3:36 PM MRN: [id number] Endoscopist: Jerene Bears , MD Age: 70 Referring MD:  Date of Birth: 1945/06/15 Gender: Female Account #: [id number] Procedure:                Colonoscopy Indications:              Screening in patient at increased risk: Family                            history of 1st-degree relative with colorectal                            cancer, Last colonoscopy 5 years ago Medicines:                Monitored Anesthesia Care Procedure:                Pre-Anesthesia Assessment:                           - Prior to the procedure, a History and Physical                            was performed, and patient medications and                            allergies were reviewed. The patient's tolerance of                            previous anesthesia was also reviewed. The risks                            and benefits of the procedure and the sedation                            options and risks were discussed with the patient.                            All questions were answered, and informed consent                            was obtained. Prior Anticoagulants: The patient has                            taken no previous anticoagulant or antiplatelet                            agents. ASA Grade Assessment: II - A patient with                            mild systemic disease. After reviewing the risks                            and benefits, the patient was deemed in  satisfactory condition to undergo the procedure.                           After obtaining informed consent, the colonoscope                            was passed under direct vision. Throughout the                            procedure, the patient's blood pressure, pulse, and                            oxygen saturations were monitored continuously. The                            Model PCF-[id number])  scope was introduced                            through the anus and advanced to the the cecum,                            identified by appendiceal orifice and ileocecal                            valve. The colonoscopy was performed without                            difficulty. The patient tolerated the procedure                            well. The quality of the bowel preparation was                            excellent. The ileocecal valve, appendiceal                            orifice, and rectum were photographed. Scope In: 3:47:12 PM Scope Out: 4:02:57 PM Scope Withdrawal Time: 0 hours 10 minutes 33 seconds  Total Procedure Duration: 0 hours 15 minutes 45 seconds  Findings:                 The digital rectal exam was normal.                           A 4 mm polyp was found in the recto-sigmoid colon.                            The polyp was sessile. The polyp was removed with a                            cold snare. Resection and retrieval were complete.                           Multiple small and large-mouthed diverticula were  found in the recto-sigmoid colon and sigmoid colon.                           The retroflexed view of the distal rectum and anal                            verge was normal and showed no anal or rectal                            abnormalities. Complications:            No immediate complications. Estimated Blood Loss:     Estimated blood loss was minimal. Impression:               - One 4 mm polyp at the recto-sigmoid colon,                            removed with a cold snare. Resected and retrieved.                           - Moderate diverticulosis in the recto-sigmoid                            colon and in the sigmoid colon.                           - The distal rectum and anal verge are normal on                            retroflexion view. Recommendation:           - Patient has a contact number available for                             emergencies. The signs and symptoms of potential                            delayed complications were discussed with the                            patient. Return to normal activities tomorrow.                            Written discharge instructions were provided to the                            patient.                           - Resume previous diet.                           - Continue present medications.                           - Await pathology results.                           -  Repeat colonoscopy in 5 years. Jerene Bears, MD 11/25/2015 4:06:36 PM This report has been signed electronically.

## 2015-11-25 NOTE — Progress Notes (Signed)
A/ox3 pleased with MAC, report to Penny RN 

## 2015-11-25 NOTE — Patient Instructions (Signed)
YOU HAD AN ENDOSCOPIC PROCEDURE TODAY AT [facility]:   Refer to the procedure report that was given to you for any specific questions about what was found during the examination.  If the procedure report does not answer your questions, please call your gastroenterologist to clarify.  If you requested that your care partner not be given the details of your procedure findings, then the procedure report has been included in a sealed envelope for you to review at your convenience later.  YOU SHOULD EXPECT: Some feelings of bloating in the abdomen. Passage of more gas than usual.  Walking can help get rid of the air that was put into your GI tract during the procedure and reduce the bloating. If you had a lower endoscopy (such as a colonoscopy or flexible sigmoidoscopy) you may notice spotting of blood in your stool or on the toilet paper. If you underwent a bowel prep for your procedure, you may not have a normal bowel movement for a few days.  Please Note:  You might notice some irritation and congestion in your nose or some drainage.  This is from the oxygen used during your procedure.  There is no need for concern and it should clear up in a day or so.  SYMPTOMS TO REPORT IMMEDIATELY:   Following lower endoscopy (colonoscopy or flexible sigmoidoscopy):  Excessive amounts of blood in the stool  Significant tenderness or worsening of abdominal pains  Swelling of the abdomen that is new, acute  Fever of 100F or higher   For urgent or emergent issues, a gastroenterologist can be reached at any hour by calling [phone].   DIET:  We do recommend a small meal at first, but then you may proceed to your regular diet.  Drink plenty of fluids but you should avoid alcoholic beverages for 24 hours.  ACTIVITY:  You should plan to take it easy for the rest of today and you should NOT DRIVE or use heavy machinery until tomorrow (because of the sedation medicines used during the test).     FOLLOW UP: Our staff will call the number listed on your records the next business day following your procedure to check on you and address any questions or concerns that you may have regarding the information given to you following your procedure. If we do not reach you, we will leave a message.  However, if you are feeling well and you are not experiencing any problems, there is no need to return our call.  We will assume that you have returned to your regular daily activities without incident.  If any biopsies were taken you will be contacted by phone or by letter within the next 1-3 weeks.  Please call us at [phone] if you have not heard about the biopsies in 3 weeks.    SIGNATURES/CONFIDENTIALITY: You and/or your care partner have signed paperwork which will be entered into your electronic medical record.  These signatures attest to the fact that that the information above on your After Visit Summary has been reviewed and is understood.  Full responsibility of the confidentiality of this discharge information lies with you and/or your care-partner.   INFORMATION ON POLYPS,DIVERTICULOSIS,& HIGH FIBER DIET GIVEN TO YOU TODAY

## 2015-11-25 NOTE — Progress Notes (Signed)
Called to room to assist during endoscopic procedure.  Patient ID and intended procedure confirmed with present staff. Received instructions for my participation in the procedure from the performing physician.  

## 2015-11-26 ENCOUNTER — Telehealth: Payer: Self-pay

## 2015-11-26 NOTE — Telephone Encounter (Signed)
  Follow up Call-  Call back number 11/25/2015  Post procedure Call Back phone  # [phone]  Permission to leave phone message Yes  Some recent data might be hidden     Patient has tolerated food without any problems. Yes was clicked in error.  Patient questions:  Do you have a fever, pain , or abdominal swelling? No. Pain Score  0 *  Have you tolerated food without any problems? No.  Have you been able to return to your normal activities? Yes.    Do you have any questions about your discharge instructions: Diet   No. Medications  No. Follow up visit  No.  Do you have questions or concerns about your Care? No.  Actions: * If pain score is 4 or above: No action needed, pain <4.

## 2015-11-28 ENCOUNTER — Telehealth: Payer: Self-pay | Admitting: *Deleted

## 2015-11-28 NOTE — Telephone Encounter (Signed)
No call made opened in error

## 2015-12-01 ENCOUNTER — Encounter: Payer: Self-pay | Admitting: Internal Medicine

## 2016-02-19 DIAGNOSIS — H401131 Primary open-angle glaucoma, bilateral, mild stage: Secondary | ICD-10-CM | POA: Diagnosis not present

## 2016-02-19 DIAGNOSIS — Z961 Presence of intraocular lens: Secondary | ICD-10-CM | POA: Diagnosis not present

## 2016-02-19 DIAGNOSIS — H2512 Age-related nuclear cataract, left eye: Secondary | ICD-10-CM | POA: Diagnosis not present

## 2016-02-19 DIAGNOSIS — H43393 Other vitreous opacities, bilateral: Secondary | ICD-10-CM | POA: Diagnosis not present

## 2016-04-21 ENCOUNTER — Telehealth: Payer: Self-pay | Admitting: *Deleted

## 2016-04-21 NOTE — Progress Notes (Signed)
Subjective:   Stacy White is a 71 y.o. female who presents for an Initial Medicare Annual Wellness Visit.  Review of Systems    No ROS.  Medicare Wellness Visit. Cardiac Risk Factors include: advanced age (>72men, >19 women)  Home Safety/Smoke Alarms:  Feels safe in home. Smoke alarms in place.  Living environment; residence and Firearm Safety: 2 story home and a basement.  Seat Belt Safety/Bike Helmet: Wears seat belt.   Counseling:   Eye Exam- Every 6 months. 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 Dental- Every 6 months.   Female:   Pap-   [facility], 07/2015-Normal.     Mammo-    09/01/15. Benign cyst. 1 year recall.   Dexa scan-  08/20/2014.   2 year recall. CCS- 11/25/15. Diverticulosis. 5 year recall.    Objective:    Today's Vitals   04/22/16 0911  BP: 128/70  Pulse: 80  Resp: 14  Temp: 98.1 F (36.7 C)  TempSrc: Oral  SpO2: 99%  Weight: 152 lb 9.6 oz (69.2 kg)  Height: 5' 6.53" (1.69 m)   Body mass index is 24.24 kg/m.   Current Medications (verified) Outpatient Encounter Prescriptions as of 04/22/2016  Medication Sig  . b complex vitamins tablet Take 1 tablet by mouth daily.  . Calcium Carbonate-Vit D-Min (CALCIUM 1200 PO) Take 2 tablets by mouth daily.   . Cholecalciferol (VITAMIN D) 1000 UNITS capsule Take 1,000 Units by mouth daily.    Marland Kitchen latanoprost (XALATAN) 0.005 % ophthalmic solution Place 1 drop into both eyes at bedtime.  . Lutein 6 MG CAPS Take 6 mg by mouth daily.   . [DISCONTINUED] b complex vitamins tablet Take 1 tablet by mouth daily.     Facility-Administered Encounter Medications as of 04/22/2016  Medication  . 0.9 %  sodium chloride infusion    Allergies (verified) Patient has no known allergies.   History: Past Medical History:  Diagnosis Date  . Cataract   . Family history of colon cancer    next colonoscopy due 2017  . Glaucoma   . Osteopenia   . PONV (postoperative nausea and vomiting)    last anethesia in  the 80s   Past Surgical History:  Procedure Laterality Date  . COLONOSCOPY  '97,'02,'07,'12   2012: Polypectomy.  Mild sigmoid diverticulosis (Dr. Vito Backers 2017  . EYE SURGERY    . GAS INSERTION  09/30/2011   Procedure: INSERTION OF GAS;  Surgeon: Hayden Pedro, MD;  Location: [facility];  Service: Ophthalmology;  Laterality: Right;  C3F8  . PARS PLANA VITRECTOMY  09/30/2011   Procedure: PARS PLANA VITRECTOMY WITH 25 GAUGE;  Surgeon: Hayden Pedro, MD;  Location: [facility];  Service: Ophthalmology;  Laterality: Right;  Macular Hole  . TUBAL LIGATION    . TYMPANOPLASTY    . WISDOM TOOTH EXTRACTION     x2   Family History  Problem Relation Age of Onset  . Hypertension Mother   . COPD Mother   . Macular degeneration Mother   . Cancer Father     colon  . Hypertension Father   . Colon cancer Father 60    died from colon ca  . Stomach cancer Neg Hx    Social History   Occupational History  . Not on file.   Social History Main Topics  . Smoking status: Never Smoker  . Smokeless tobacco: Never Used  . Alcohol use No  . Drug use: No  . Sexual activity: No  Tobacco Counseling Counseling given: Not Answered   Activities of Daily Living In your present state of health, do you have any difficulty performing the following activities: 04/22/2016  Hearing? N  Vision? N  Difficulty concentrating or making decisions? N  Walking or climbing stairs? N  Dressing or bathing? N  Doing errands, shopping? N  Preparing Food and eating ? N  Using the Toilet? N  In the past six months, have you accidently leaked urine? Y  Do you have problems with loss of bowel control? Y  Managing your Medications? N  Managing your Finances? N  Housekeeping or managing your Housekeeping? N  Some recent data might be hidden    Immunizations and Health Maintenance Immunization History  Administered Date(s) Administered  . Tdap 07/01/2010  . Zoster 07/11/2014   Health Maintenance Due  Topic  Date Due  . Hepatitis C Screening  07/09/1945  . PNA vac Low Risk Adult (1 of 2 - PCV13) 12/18/2011    Patient Care Team: Briscoe Deutscher, DO as PCP - General (Family Medicine) Jerene Bears, MD as Consulting Physician (Gastroenterology) Clent Jacks, MD as Consulting Physician (Ophthalmology) Hayden Pedro, MD as Consulting Physician (Ophthalmology)  Indicate any recent Medical Services you may have received from other than [facility] providers in the past year (date may be approximate).     Assessment:   This is a routine wellness examination for [employer]. Physical assessment deferred to PCP.  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 Able to hear conversational tones w/o difficulty. No issues reported.   Vision Screening Comments: Q6 months. Sees Dr Katy Fitch and Zigmund Daniel. Hx glaucoma and cataract to right eye.   Dietary issues and exercise activities discussed: Current Exercise Habits: Home exercise routine, Type of exercise: walking, Time (Minutes): 30, Frequency (Times/Week): 5, Weekly Exercise (Minutes/Week): 150, Intensity: Moderate   Diet (meal preparation, eat out, water intake, caffeinated beverages, dairy products, fruits and vegetables): No coffee, 1 caffeine free diet soda. Snacks on graham crackers and cool whip or cottage cheese and fruit. Breakfast: All Bran cereal, banana, water Lunch: Sandwich, chips, fruit Dinner: Normally out to eat  Goals    . Decrease sweets      Depression Screen PHQ 2/9 Scores 04/22/2016 07/16/2014  PHQ - 2 Score 0 0  Exception Documentation - Patient refusal    Fall Risk Fall Risk  04/22/2016 07/16/2014  Falls in the past year? No No    Cognitive Function:   Ad8 score reviewed for issues:  Issues making decisions:no  Less interest in hobbies / activities:no  Repeats questions, stories (family complaining):no  Trouble using ordinary gadgets (microwave, computer, phone):no  Forgets  the month or year: no  Mismanaging finances: no  Remembering appts:no  Daily problems with thinking and/or memory:no Ad8 score is=0       Screening Tests Health Maintenance  Topic Date Due  . Hepatitis C Screening  1945-04-09  . PNA vac Low Risk Adult (1 of 2 - PCV13) 12/18/2011  . INFLUENZA VACCINE  08/18/2016  . MAMMOGRAM  08/31/2017  . TETANUS/TDAP  06/30/2020  . COLONOSCOPY  11/24/2020  . DEXA SCAN  Completed      Plan:    Bring a copy of your advance directives to your next office visit. Bone density scan in August.  During the course of the visit, Jeff was educated and counseled about the following appropriate screening and preventive services:   Vaccines to include Pneumoccal,  Influenza, Hepatitis B, Td, Zostavax, HCV  Cardiovascular disease screening  Colorectal cancer screening  Bone density screening  Diabetes screening  Glaucoma screening  Mammography/PAP  Nutrition counseling  Smoking cessation counseling  Patient Instructions (the written plan) were given to the patient.    Ree Edman, RN   04/22/2016   I have personally reviewed the Medicare Annual Wellness questionnaire and have noted 1. The patient's medical and social history 2. Their use of alcohol, tobacco or illicit drugs 3. Their current medications and supplements 4. The patient's functional ability including ADL's, fall risks, home safety risks and hearing or visual impairment. 5. Diet and physical activities 6. Evidence for depression or mood disorders 7. Reviewed Updated provider list, see scanned forms and [facility] Snapshot.   The patients weight, height, BMI and visual acuity have been recorded in the chart I have made referrals, counseling and provided education to the patient based review of the above and I have provided the pt with a written personalized care plan for preventive services.  Briscoe Deutscher, D.O. [place], [facility]

## 2016-04-21 NOTE — Progress Notes (Signed)
Pre visit review using our clinic review tool, if applicable. No additional management support is needed unless otherwise documented below in the visit note. 

## 2016-04-21 NOTE — Telephone Encounter (Signed)
Scheduled AWV.

## 2016-04-22 ENCOUNTER — Encounter: Payer: Self-pay | Admitting: Family Medicine

## 2016-04-22 ENCOUNTER — Ambulatory Visit (INDEPENDENT_AMBULATORY_CARE_PROVIDER_SITE_OTHER): Payer: Medicare Other | Admitting: Family Medicine

## 2016-04-22 VITALS — BP 128/70 | HR 80 | Temp 98.1°F | Resp 14 | Ht 66.54 in | Wt 152.6 lb

## 2016-04-22 DIAGNOSIS — E7800 Pure hypercholesterolemia, unspecified: Secondary | ICD-10-CM

## 2016-04-22 DIAGNOSIS — K0889 Other specified disorders of teeth and supporting structures: Secondary | ICD-10-CM | POA: Diagnosis not present

## 2016-04-22 DIAGNOSIS — Z0000 Encounter for general adult medical examination without abnormal findings: Secondary | ICD-10-CM

## 2016-04-22 DIAGNOSIS — Z780 Asymptomatic menopausal state: Secondary | ICD-10-CM

## 2016-04-22 DIAGNOSIS — S025XXA Fracture of tooth (traumatic), initial encounter for closed fracture: Secondary | ICD-10-CM

## 2016-04-22 DIAGNOSIS — M8580 Other specified disorders of bone density and structure, unspecified site: Secondary | ICD-10-CM | POA: Diagnosis not present

## 2016-04-22 DIAGNOSIS — R7989 Other specified abnormal findings of blood chemistry: Secondary | ICD-10-CM

## 2016-04-22 DIAGNOSIS — E559 Vitamin D deficiency, unspecified: Secondary | ICD-10-CM

## 2016-04-22 LAB — COMPREHENSIVE METABOLIC PANEL
ALT: 17 U/L (ref 0–35)
AST: 23 U/L (ref 0–37)
Albumin: 4.1 g/dL (ref 3.5–5.2)
Alkaline Phosphatase: 89 U/L (ref 39–117)
BUN: 17 mg/dL (ref 6–23)
CO2: 30 mEq/L (ref 19–32)
Calcium: 9.5 mg/dL (ref 8.4–10.5)
Chloride: 105 mEq/L (ref 96–112)
Creatinine, Ser: 1.03 mg/dL (ref 0.40–1.20)
GFR: 56.41 mL/min — ABNORMAL LOW (ref >60.00)
Glucose, Bld: 93 mg/dL (ref 70–99)
Potassium: 4.5 mEq/L (ref 3.5–5.1)
Sodium: 140 mEq/L (ref 135–145)
Total Bilirubin: 1.2 mg/dL (ref 0.2–1.2)
Total Protein: 7.4 g/dL (ref 6.0–8.3)

## 2016-04-22 LAB — CBC WITH DIFFERENTIAL/PLATELET
Basophils Absolute: 0 10*3/uL (ref 0.0–0.1)
Basophils Relative: 0.9 % (ref 0.0–3.0)
Eosinophils Absolute: 0.1 10*3/uL (ref 0.0–0.7)
Eosinophils Relative: 2.2 % (ref 0.0–5.0)
HCT: 40.5 % (ref 36.0–46.0)
Hemoglobin: 13.6 g/dL (ref 12.0–15.0)
Lymphocytes Relative: 28.7 % (ref 12.0–46.0)
Lymphs Abs: 1.5 10*3/uL (ref 0.7–4.0)
MCHC: 33.6 g/dL (ref 30.0–36.0)
MCV: 94 fl (ref 78.0–100.0)
Monocytes Absolute: 0.6 10*3/uL (ref 0.1–1.0)
Monocytes Relative: 11.8 % (ref 3.0–12.0)
Neutro Abs: 3 10*3/uL (ref 1.4–7.7)
Neutrophils Relative %: 56.4 % (ref 43.0–77.0)
Platelets: 216 10*3/uL (ref 150.0–400.0)
RBC: 4.31 Mil/uL (ref 3.87–5.11)
RDW: 13 % (ref 11.5–15.5)
WBC: 5.4 10*3/uL (ref 4.0–10.5)

## 2016-04-22 LAB — LIPID PANEL
Cholesterol: 172 mg/dL (ref 0–200)
HDL: 54.1 mg/dL (ref >39.00)
LDL Cholesterol: 92 mg/dL (ref 0–99)
NonHDL: 118.01
Total CHOL/HDL Ratio: 3
Triglycerides: 131 mg/dL (ref 0.0–149.0)
VLDL: 26.2 mg/dL (ref 0.0–40.0)

## 2016-04-22 LAB — VITAMIN D 25 HYDROXY (VIT D DEFICIENCY, FRACTURES): VITD: 54.12 ng/mL (ref 30.00–100.00)

## 2016-04-22 MED ORDER — AMOXICILLIN 875 MG PO TABS: 875.0000 mg | ORAL_TABLET | Freq: Two times a day (BID) | ORAL | 0 refills | Status: DC

## 2016-04-22 NOTE — Patient Instructions (Addendum)
Bring a copy of your advance directives to your next office visit.  High-Fiber Diet Fiber, also called dietary fiber, is a type of carbohydrate found in fruits, vegetables, whole grains, and beans. A high-fiber diet can have many health benefits. Your health care provider may recommend a high-fiber diet to help:  Prevent constipation. Fiber can make your bowel movements more regular.  Lower your cholesterol.  Relieve hemorrhoids, uncomplicated diverticulosis, or irritable bowel syndrome.  Prevent overeating as part of a weight-loss plan.  Prevent heart disease, type 2 diabetes, and certain cancers. What is my plan? The recommended daily intake of fiber includes:  38 grams for men under age 5.  79 grams for men over age 37.  54 grams for women under age 34.  78 grams for women over age 61. You can get the recommended daily intake of dietary fiber by eating a variety of fruits, vegetables, grains, and beans. Your health care provider may also recommend a fiber supplement if it is not possible to get enough fiber through your diet. What do I need to know about a high-fiber diet?  Fiber supplements have not been widely studied for their effectiveness, so it is better to get fiber through food sources.  Always check the fiber content on thenutrition facts label of any prepackaged food. Look for foods that contain at least 5 grams of fiber per serving.  Ask your dietitian if you have questions about specific foods that are related to your condition, especially if those foods are not listed in the following section.  Increase your daily fiber consumption gradually. Increasing your intake of dietary fiber too quickly may cause bloating, cramping, or gas.  Drink plenty of water. Water helps you to digest fiber. What foods can I eat? Grains  Whole-grain breads. Multigrain cereal. Oats and oatmeal. Brown rice. Barley. Bulgur wheat. [place]. Bran muffins. Popcorn. Rye wafer  crackers. Vegetables  Sweet potatoes. Spinach. Kale. Artichokes. Cabbage. Broccoli. Green peas. Carrots. Squash. Fruits  Berries. Pears. Apples. Oranges. Avocados. Prunes and raisins. Dried figs. Meats and Other Protein Sources  [place], kidney, pinto, and soy beans. Split peas. Lentils. Nuts and seeds. Dairy  Fiber-fortified yogurt. Beverages  Fiber-fortified soy milk. Fiber-fortified orange juice. Other  Fiber bars. The items listed above may not be a complete list of recommended foods or beverages. Contact your dietitian for more options.  What foods are not recommended? Grains  White bread. Pasta made with refined flour. White rice. Vegetables  Fried potatoes. Canned vegetables. Well-cooked vegetables. Fruits  Fruit juice. Cooked, strained fruit. Meats and Other Protein Sources  Fatty cuts of meat. Fried Sales executive or fried fish. Dairy  Milk. Yogurt. Cream cheese. Sour cream. Beverages  Soft drinks. Other  Cakes and pastries. Butter and oils. The items listed above may not be a complete list of foods and beverages to avoid. Contact your dietitian for more information.  What are some tips for including high-fiber foods in my diet?  Eat a wide variety of high-fiber foods.  Make sure that half of all grains consumed each day are whole grains.  Replace breads and cereals made from refined flour or white flour with whole-grain breads and cereals.  Replace white rice with brown rice, bulgur wheat, or millet.  Start the day with a breakfast that is high in fiber, such as a cereal that contains at least 5 grams of fiber per serving.  Use beans in place of meat in soups, salads, or pasta.  Eat high-fiber snacks, such as  berries, raw vegetables, nuts, or popcorn. This information is not intended to replace advice given to you by your health care provider. Make sure you discuss any questions you have with your health care provider. Document Released: 01/04/2005 Document Revised:  06/12/2015 Document Reviewed: 06/19/2013 Elsevier Interactive Patient Education  2017 [employer].

## 2016-04-22 NOTE — Progress Notes (Signed)
Stacy White is a 71 y.o. female is here to [facility].   History of Present Illness:  Stacy White acting as scribe for Dr. Juleen China  CC: Patient is here today to establish care. She had a tooth break last night and would like it to be looked at.   Dental Pain   This is a new problem. The current episode started yesterday. The problem has been unchanged. The pain is mild. Associated symptoms include thermal sensitivity. Pertinent negatives include no difficulty swallowing, facial pain, fever or oral bleeding. She has tried nothing for the symptoms.    Health Maintenance Due  Topic Date Due  . Hepatitis C Screening  [birth year]    PMHx, SurgHx, SocialHx, Medications, and Allergies were reviewed in the Visit Navigator and updated as appropriate.   Past Medical History:  Diagnosis Date  . Cataract   . Family history of colon cancer    next colonoscopy due 2017  . Glaucoma   . Osteopenia   . PONV (postoperative nausea and vomiting)    last anethesia in the 80s     Past Surgical History:  Procedure Laterality Date  . COLONOSCOPY  '97,'02,'07,'12   2012: Polypectomy.  Mild sigmoid diverticulosis (Dr. Vito Backers 2017  . EYE SURGERY    . GAS INSERTION  09/30/2011   Procedure: INSERTION OF GAS;  Surgeon: Hayden Pedro, MD;  Location: [facility];  Service: Ophthalmology;  Laterality: Right;  [id number]  . PARS PLANA VITRECTOMY  09/30/2011   Procedure: PARS PLANA VITRECTOMY WITH 25 GAUGE;  Surgeon: Hayden Pedro, MD;  Location: [facility];  Service: Ophthalmology;  Laterality: Right;  Macular Hole  . TUBAL LIGATION    . TYMPANOPLASTY    . WISDOM TOOTH EXTRACTION     x2     Family History  Problem Relation Age of Onset  . Hypertension Mother   . COPD Mother   . Macular degeneration Mother   . Cancer Father     colon  . Hypertension Father   . Colon cancer Father 100    died from colon ca  . Stomach cancer Neg Hx     Social History  Substance Use Topics  . Smoking  status: Never Smoker  . Smokeless tobacco: Never Used  . Alcohol use No    Current Medications and Allergies:    Current Outpatient Prescriptions:  .  b complex vitamins tablet, Take 1 tablet by mouth daily., Disp: , Rfl:  .  Calcium Carbonate-Vit D-Min (CALCIUM 1200 PO), Take 2 tablets by mouth daily. , Disp: , Rfl:  .  Cholecalciferol (VITAMIN D) 1000 UNITS capsule, Take 1,000 Units by mouth daily.  , Disp: , Rfl:  .  latanoprost (XALATAN) 0.005 % ophthalmic solution, Place 1 drop into both eyes at bedtime., Disp: , Rfl:  .  Lutein 6 MG CAPS, Take 6 mg by mouth daily. , Disp: , Rfl:  .  amoxicillin (AMOXIL) 875 MG tablet, Take 1 tablet (875 mg total) by mouth 2 (two) times daily., Disp: 20 tablet, Rfl: 0  Current Facility-Administered Medications:  .  0.9 %  sodium chloride infusion, 500 mL, Intravenous, Continuous, Jerene Bears, MD  No Known Allergies   Review of Systems:   Review of Systems  Constitutional: Negative for chills, fever and malaise/fatigue.  HENT: Negative for congestion, ear pain, sinus pain and sore throat.   Eyes: Negative for blurred vision and double vision.  Respiratory: Negative for cough, shortness of breath and wheezing.  Cardiovascular: Negative for chest pain, palpitations and leg swelling.  Gastrointestinal: Negative for abdominal pain, constipation, diarrhea and vomiting.  Genitourinary: Negative for dysuria.  Musculoskeletal: Negative for back pain, joint pain and neck pain.  Skin: Negative for itching and rash.  Neurological: Negative for dizziness and headaches.  Psychiatric/Behavioral: Negative for depression, hallucinations and memory loss.    Vitals:   Vitals:   04/22/16 0911  BP: 128/70  Pulse: 80  Resp: 14  Temp: 98.1 F (36.7 C)  TempSrc: Oral  SpO2: 99%  Weight: 152 lb 9.6 oz (69.2 kg)  Height: 5' 6.53" (1.69 m)     Body mass index is 24.24 kg/m.   Physical Exam:   Physical Exam  Constitutional: She appears  well-nourished.  HENT:  Head: Normocephalic and atraumatic.  Mouth/Throat:    Eyes: EOM are normal. Pupils are equal, round, and reactive to light.  Neck: Normal range of motion. Neck supple.  Cardiovascular: Normal rate, regular rhythm, normal heart sounds and intact distal pulses.   Pulmonary/Chest: Effort normal.  Abdominal: Soft.  Skin: Skin is warm.  Psychiatric: She has a normal mood and affect. Her behavior is normal.  Nursing note and vitals reviewed.  Results for orders placed or performed in visit on 04/22/16  Comprehensive metabolic panel  Result Value Ref Range   Sodium 140 135 - 145 mEq/L   Potassium 4.5 3.5 - 5.1 mEq/L   Chloride 105 96 - 112 mEq/L   CO2 30 19 - 32 mEq/L   Glucose, Bld 93 70 - 99 mg/dL   BUN 17 6 - 23 mg/dL   Creatinine, Ser 1.03 0.40 - 1.20 mg/dL   Total Bilirubin 1.2 0.2 - 1.2 mg/dL   Alkaline Phosphatase 89 39 - 117 U/L   AST 23 0 - 37 U/L   ALT 17 0 - 35 U/L   Total Protein 7.4 6.0 - 8.3 g/dL   Albumin 4.1 3.5 - 5.2 g/dL   Calcium 9.5 8.4 - 10.5 mg/dL   GFR 56.41 (L) >60.00 mL/min  VITAMIN D 25 Hydroxy (Vit-D Deficiency, Fractures)  Result Value Ref Range   VITD 54.12 30.00 - 100.00 ng/mL  Lipid panel  Result Value Ref Range   Cholesterol 172 0 - 200 mg/dL   Triglycerides 131.0 0.0 - 149.0 mg/dL   HDL 54.10 >39.00 mg/dL   VLDL 26.2 0.0 - 40.0 mg/dL   LDL Cholesterol 92 0 - 99 mg/dL   Total CHOL/HDL Ratio 3    NonHDL 118.01   CBC with Differential/Platelet  Result Value Ref Range   WBC 5.4 4.0 - 10.5 K/uL   RBC 4.31 3.87 - 5.11 Mil/uL   Hemoglobin 13.6 12.0 - 15.0 g/dL   HCT 40.5 36.0 - 46.0 %   MCV 94.0 78.0 - 100.0 fl   MCHC 33.6 30.0 - 36.0 g/dL   RDW 13.0 11.5 - 15.5 %   Platelets 216.0 150.0 - 400.0 K/uL   Neutrophils Relative % 56.4 43.0 - 77.0 %   Lymphocytes Relative 28.7 12.0 - 46.0 %   Monocytes Relative 11.8 3.0 - 12.0 %   Eosinophils Relative 2.2 0.0 - 5.0 %   Basophils Relative 0.9 0.0 - 3.0 %   Neutro Abs 3.0  1.4 - 7.7 K/uL   Lymphs Abs 1.5 0.7 - 4.0 K/uL   Monocytes Absolute 0.6 0.1 - 1.0 K/uL   Eosinophils Absolute 0.1 0.0 - 0.7 K/uL   Basophils Absolute 0.0 0.0 - 0.1 K/uL     Assessment and  Plan:    Stacy White was seen today for medicare wellness, dental injury and establish care.  Diagnoses and all orders for this visit:  Encounter for Medicare annual wellness exam  Osteopenia, unspecified location -     DG Bone Density; Future  Elevated LDL cholesterol level -     Lipid panel  Low vitamin D level -     VITAMIN D 25 Hydroxy (Vit-D Deficiency, Fractures)  Dentalgia -     Comprehensive metabolic panel -     CBC with Differential/Platelet -     amoxicillin (AMOXIL) 875 MG tablet; Take 1 tablet (875 mg total) by mouth 2 (two) times daily.  Postmenopausal -     DG Bone Density; Future  Closed fracture of tooth, initial encounter -     CBC with Differential/Platelet    . Reviewed expectations re: course of current medical issues. . Discussed self-management of symptoms. . Outlined signs and symptoms indicating need for more acute intervention. . Patient verbalized understanding and all questions were answered. . See orders for this visit as documented in the electronic medical record. . Patient received an After Visit Summary.  Records requested if needed. I spent 30 minutes with this patient, greater than 50% was face-to-face time counseling regarding the above diagnoses.  White served as Education administrator during this visit. History, Physical, and Plan performed by medical provider. Documentation and orders reviewed and attested to. Briscoe Deutscher, D.O.  Briscoe Deutscher, [place], [facility] 04/22/2016   Follow-up: Return in about 1 year (around 04/22/2017).  Meds ordered this encounter  Medications  . latanoprost (XALATAN) 0.005 % ophthalmic solution    Sig: Place 1 drop into both eyes at bedtime.  Marland Kitchen b complex vitamins tablet    Sig: Take 1 tablet by mouth daily.  Marland Kitchen  amoxicillin (AMOXIL) 875 MG tablet    Sig: Take 1 tablet (875 mg total) by mouth 2 (two) times daily.    Dispense:  20 tablet    Refill:  0   Medications Discontinued During This Encounter  Medication Reason  . b complex vitamins tablet Patient has not taken in last 30 days   Orders Placed This Encounter  Procedures  . DG Bone Density  . Comprehensive metabolic panel  . VITAMIN D 25 Hydroxy (Vit-D Deficiency, Fractures)  . Lipid panel  . CBC with Differential/Platelet

## 2016-05-04 ENCOUNTER — Ambulatory Visit (INDEPENDENT_AMBULATORY_CARE_PROVIDER_SITE_OTHER): Payer: Medicare Other | Admitting: Ophthalmology

## 2016-05-04 DIAGNOSIS — H2511 Age-related nuclear cataract, right eye: Secondary | ICD-10-CM | POA: Diagnosis not present

## 2016-05-04 DIAGNOSIS — H35341 Macular cyst, hole, or pseudohole, right eye: Secondary | ICD-10-CM

## 2016-05-04 DIAGNOSIS — H35373 Puckering of macula, bilateral: Secondary | ICD-10-CM | POA: Diagnosis not present

## 2016-05-04 DIAGNOSIS — H43812 Vitreous degeneration, left eye: Secondary | ICD-10-CM | POA: Diagnosis not present

## 2016-06-23 ENCOUNTER — Telehealth: Payer: Self-pay | Admitting: Family Medicine

## 2016-06-23 NOTE — Telephone Encounter (Signed)
Do you have someone specific that you like to refer to?

## 2016-06-23 NOTE — Telephone Encounter (Signed)
Patient wants to know if there is a preferred dermatologist that we refer to?  Thank you,  -LL

## 2016-06-27 NOTE — Telephone Encounter (Signed)
[place].

## 2016-06-28 NOTE — Telephone Encounter (Signed)
Patient stated that she has already called and scheduled with another dermatologist.

## 2016-07-27 ENCOUNTER — Ambulatory Visit (INDEPENDENT_AMBULATORY_CARE_PROVIDER_SITE_OTHER): Payer: Medicare Other | Admitting: Family Medicine

## 2016-07-27 ENCOUNTER — Encounter: Payer: Self-pay | Admitting: Family Medicine

## 2016-07-27 VITALS — BP 118/72 | HR 74 | Temp 98.0°F | Ht 66.53 in | Wt 148.4 lb

## 2016-07-27 DIAGNOSIS — B029 Zoster without complications: Secondary | ICD-10-CM | POA: Diagnosis not present

## 2016-07-27 MED ORDER — VALACYCLOVIR HCL 1 G PO TABS: 1000.0000 mg | ORAL_TABLET | Freq: Three times a day (TID) | ORAL | 0 refills | Status: AC

## 2016-07-27 NOTE — Progress Notes (Signed)
Stacy White is a 71 y.o. female here for an acute visit.  History of Present Illness:   Water quality scientist, CMA, acting as scribe for Dr. Juleen China.  Rash  This is a new problem. The current episode started yesterday. The problem has been gradually worsening since onset. The affected locations include the left shoulder. The rash is characterized by blistering, burning and itchiness. She was exposed to nothing. Pertinent negatives include no cough, diarrhea, fever, joint pain, shortness of breath or vomiting. Past treatments include nothing. Her past medical history is significant for varicella.   PMHx, SurgHx, SocialHx, Medications, and Allergies were reviewed in the Visit Navigator and updated as appropriate.  Current Medications:   .  b complex vitamins tablet, Take 1 tablet by mouth daily., Disp: , Rfl:  .  Calcium Carbonate-Vit D-Min (CALCIUM 1200 PO), Take 2 tablets by mouth daily. , Disp: , Rfl:  .  Cholecalciferol (VITAMIN D) 1000 UNITS capsule, Take 1,000 Units by mouth daily.  , Disp: , Rfl:  .  latanoprost (XALATAN) 0.005 % ophthalmic solution, Place 1 drop into both eyes at bedtime., Disp: , Rfl:  .  Lutein 6 MG CAPS, Take 6 mg by mouth daily. , Disp: , Rfl:   No Known Allergies   Review of Systems:   Review of Systems  Constitutional: Negative for chills, fever, malaise/fatigue and weight loss.  Respiratory: Negative for cough, shortness of breath and wheezing.   Cardiovascular: Negative for chest pain, palpitations and leg swelling.  Gastrointestinal: Negative for abdominal pain, constipation, diarrhea, nausea and vomiting.  Genitourinary: Negative for dysuria and urgency.  Musculoskeletal: Negative for joint pain and myalgias.  Skin: Negative for rash.  Neurological: Negative for dizziness and headaches.  Psychiatric/Behavioral: Negative for depression, substance abuse and suicidal ideas. The patient is not nervous/anxious.    Vitals:   Vitals:   07/27/16 0902  BP:  118/72  Pulse: 74  Temp: 98 F (36.7 C)  TempSrc: Oral  SpO2: 97%  Weight: 148 lb 6.4 oz (67.3 kg)  Height: 5' 6.53" (1.69 m)     Body mass index is 23.57 kg/m.  Physical Exam:   Physical Exam  Constitutional: She appears well-developed and well-nourished. No distress.  HENT:  Head: Normocephalic and atraumatic.  Eyes: EOM are normal. Pupils are equal, round, and reactive to light.  Neck: Normal range of motion. Neck supple.  Cardiovascular: Normal rate, regular rhythm, normal heart sounds and intact distal pulses.   Pulmonary/Chest: Effort normal.  Abdominal: Soft.  Musculoskeletal:       Back:  Skin: Skin is warm.  Psychiatric: She has a normal mood and affect. Her behavior is normal.  Nursing note and vitals reviewed.  Results for orders placed or performed in visit on 04/22/16  Comprehensive metabolic panel  Result Value Ref Range   Sodium 140 135 - 145 mEq/L   Potassium 4.5 3.5 - 5.1 mEq/L   Chloride 105 96 - 112 mEq/L   CO2 30 19 - 32 mEq/L   Glucose, Bld 93 70 - 99 mg/dL   BUN 17 6 - 23 mg/dL   Creatinine, Ser 1.03 0.40 - 1.20 mg/dL   Total Bilirubin 1.2 0.2 - 1.2 mg/dL   Alkaline Phosphatase 89 39 - 117 U/L   AST 23 0 - 37 U/L   ALT 17 0 - 35 U/L   Total Protein 7.4 6.0 - 8.3 g/dL   Albumin 4.1 3.5 - 5.2 g/dL   Calcium 9.5 8.4 - 10.5 mg/dL  GFR 56.41 (L) >60.00 mL/min  VITAMIN D 25 Hydroxy (Vit-D Deficiency, Fractures)  Result Value Ref Range   VITD 54.12 30.00 - 100.00 ng/mL  Lipid panel  Result Value Ref Range   Cholesterol 172 0 - 200 mg/dL   Triglycerides 131.0 0.0 - 149.0 mg/dL   HDL 54.10 >39.00 mg/dL   VLDL 26.2 0.0 - 40.0 mg/dL   LDL Cholesterol 92 0 - 99 mg/dL   Total CHOL/HDL Ratio 3    NonHDL 118.01   CBC with Differential/Platelet  Result Value Ref Range   WBC 5.4 4.0 - 10.5 K/uL   RBC 4.31 3.87 - 5.11 Mil/uL   Hemoglobin 13.6 12.0 - 15.0 g/dL   HCT 40.5 36.0 - 46.0 %   MCV 94.0 78.0 - 100.0 fl   MCHC 33.6 30.0 - 36.0 g/dL   RDW  13.0 11.5 - 15.5 %   Platelets 216.0 150.0 - 400.0 K/uL   Neutrophils Relative % 56.4 43.0 - 77.0 %   Lymphocytes Relative 28.7 12.0 - 46.0 %   Monocytes Relative 11.8 3.0 - 12.0 %   Eosinophils Relative 2.2 0.0 - 5.0 %   Basophils Relative 0.9 0.0 - 3.0 %   Neutro Abs 3.0 1.4 - 7.7 K/uL   Lymphs Abs 1.5 0.7 - 4.0 K/uL   Monocytes Absolute 0.6 0.1 - 1.0 K/uL   Eosinophils Absolute 0.1 0.0 - 0.7 K/uL   Basophils Absolute 0.0 0.0 - 0.1 K/uL   Assessment and Plan:   [name] was seen today for acute visit and rash.  Diagnoses and all orders for this visit:  Herpes zoster without complication -     valACYclovir (VALTREX) 1000 MG tablet; Take 1 tablet (1,000 mg total) by mouth 3 (three) times daily.   . Reviewed expectations re: course of current medical issues. . Discussed self-management of symptoms. . Outlined signs and symptoms indicating need for more acute intervention. . Patient verbalized understanding and all questions were answered. Marland Kitchen Health Maintenance issues including appropriate healthy diet, exercise, and smoking avoidance were discussed with patient. . See orders for this visit as documented in the electronic medical record. . Patient received an After Visit Summary.  CMA served as Education administrator during this visit. History, Physical, and Plan performed by medical provider. The above documentation has been reviewed and is accurate and complete. Briscoe Deutscher, D.O.  Briscoe Deutscher, DO [place], [facility] 07/27/2016  Future Appointments Date Time Provider [place]  04/22/2017 8:15 AM Briscoe Deutscher, DO [facility] None  04/25/2017 9:00 AM Stephanie Acre, RN [facility] None  04/27/2017 7:45 AM Hayden Pedro, MD [facility] None

## 2016-08-02 DIAGNOSIS — B029 Zoster without complications: Secondary | ICD-10-CM | POA: Diagnosis not present

## 2016-08-02 DIAGNOSIS — L720 Epidermal cyst: Secondary | ICD-10-CM | POA: Diagnosis not present

## 2016-08-02 DIAGNOSIS — L821 Other seborrheic keratosis: Secondary | ICD-10-CM | POA: Diagnosis not present

## 2016-08-18 DIAGNOSIS — H401131 Primary open-angle glaucoma, bilateral, mild stage: Secondary | ICD-10-CM | POA: Diagnosis not present

## 2016-09-03 ENCOUNTER — Encounter: Payer: Self-pay | Admitting: Family Medicine

## 2016-09-03 DIAGNOSIS — Z1231 Encounter for screening mammogram for malignant neoplasm of breast: Secondary | ICD-10-CM | POA: Diagnosis not present

## 2016-09-03 DIAGNOSIS — M8589 Other specified disorders of bone density and structure, multiple sites: Secondary | ICD-10-CM | POA: Diagnosis not present

## 2016-09-03 LAB — HM DEXA SCAN

## 2016-09-08 ENCOUNTER — Encounter: Payer: Self-pay | Admitting: Family Medicine

## 2016-09-08 DIAGNOSIS — N6001 Solitary cyst of right breast: Secondary | ICD-10-CM | POA: Diagnosis not present

## 2016-09-21 ENCOUNTER — Encounter: Payer: Self-pay | Admitting: Family Medicine

## 2017-02-24 DIAGNOSIS — Z961 Presence of intraocular lens: Secondary | ICD-10-CM | POA: Diagnosis not present

## 2017-02-24 DIAGNOSIS — H2512 Age-related nuclear cataract, left eye: Secondary | ICD-10-CM | POA: Diagnosis not present

## 2017-02-24 DIAGNOSIS — H401131 Primary open-angle glaucoma, bilateral, mild stage: Secondary | ICD-10-CM | POA: Diagnosis not present

## 2017-02-24 DIAGNOSIS — H04123 Dry eye syndrome of bilateral lacrimal glands: Secondary | ICD-10-CM | POA: Diagnosis not present

## 2017-04-21 NOTE — Progress Notes (Addendum)
Stacy White is a 72 y.o. female is here for follow up.  History of Present Illness:   HPI: Patient follows up for her yearly exam.  She has no ongoing medical issues other than right eye glaucoma and cataract.  She is followed by Dr. Katy Fitch. She also has macular hole and it follows yearly with Dr. Zigmund Daniel.  She has a history of colon polyps in the family history of colon cancer, so has a colonoscopy every 5 years.  She is up-to-date on other health maintenance issues.  Labs today.  See scanned documents for Medicare wellness checkup questionnaire.  Health Maintenance Due  Topic Date Due  . Hepatitis C Screening  [birth year]   Depression screen [facility] 2/9 04/22/2016 07/16/2014  Decreased Interest 0 0  Down, Depressed, Hopeless 0 0  PHQ - 2 Score 0 0   PMHx, SurgHx, SocialHx, FamHx, Medications, and Allergies were reviewed in the Visit Navigator and updated as appropriate.   Patient Active Problem List   Diagnosis Date Noted  . Elevated LDL cholesterol level 07/16/2014  . 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 Osteopenia 07/01/2010  . Family history of colon cancer 07/01/2010  . Glaucoma 07/01/2010   Social History   Tobacco Use  . Smoking status: Never Smoker  . Smokeless tobacco: Never Used  Substance Use Topics  . Alcohol use: No  . Drug use: No   Current Medications and Allergies:   Current Outpatient Medications:  .  b complex vitamins tablet, Take 1 tablet by mouth daily., Disp: , Rfl:  .  Calcium Carbonate-Vit D-Min (CALCIUM 1200 PO), Take 2 tablets by mouth daily. , Disp: , Rfl:  .  Cholecalciferol (VITAMIN D) 1000 UNITS capsule, Take 1,000 Units by mouth daily.  , Disp: , Rfl:  .  latanoprost (XALATAN) 0.005 % ophthalmic solution, Place 1 drop into both eyes at bedtime., Disp: , Rfl:  .  Lutein 6 MG CAPS, Take 6 mg by mouth daily. , Disp: , Rfl:   No Known Allergies Review of Systems   Pertinent items are noted in the HPI. Otherwise, ROS is negative.  Vitals:   Vitals:   04/22/17 0820  BP: 118/70  Pulse: 82  Temp: 98.6 F (37 C)  TempSrc: Oral  SpO2: 97%  Weight: 148 lb 6.4 oz (67.3 kg)  Height: 5' 6.5" (1.689 m)     Body mass index is 23.59 kg/m.   Physical Exam:   Physical Exam  Constitutional: She is oriented to person, place, and time. She appears well-developed and well-nourished. No distress.  HENT:  Head: Normocephalic and atraumatic.  Right Ear: External ear normal.  Left Ear: External ear normal.  Nose: Nose normal.  Mouth/Throat: Oropharynx is clear and moist.  Eyes: Pupils are equal, round, and reactive to light. Conjunctivae and EOM are normal.  Neck: Normal range of motion. Neck supple. No thyromegaly present.  Cardiovascular: Normal rate, regular rhythm, normal heart sounds and intact distal pulses.  Pulmonary/Chest: Effort normal and breath sounds normal.  Abdominal: Soft. Bowel sounds are normal.  Musculoskeletal: Normal range of motion.  Lymphadenopathy:    She has no cervical adenopathy.  Neurological: She is alert and oriented to person, place, and time.  Skin: Skin is warm and dry. Capillary refill takes less than 2 seconds.  Psychiatric: She has a normal mood and affect. Her behavior is normal.  Nursing note and vitals reviewed.   Results for orders placed or performed in visit on 09/21/16  HM DEXA SCAN  Result  Value Ref Range   HM Dexa Scan OSTEOPENIA    Assessment and Plan:   Stacy White was seen today for follow-up.  Diagnoses and all orders for this visit:  Elevated LDL cholesterol level -     Comprehensive metabolic panel -     Lipid panel  Osteopenia, unspecified location -     CBC with Differential/Platelet  Family history of colon cancer -     CBC with Differential/Platelet -     Comprehensive metabolic panel  Encounter for hepatitis C virus screening test for high risk patient -     Hepatitis C antibody    . Reviewed expectations re: course of current medical issues. . Discussed self-management of  symptoms. . Outlined signs and symptoms indicating need for more acute intervention. . Patient verbalized understanding and all questions were answered. Marland Kitchen Health Maintenance issues including appropriate healthy diet, exercise, and smoking avoidance were discussed with patient. . See orders for this visit as documented in the electronic medical record. . Patient received an After Visit Summary.  Briscoe Deutscher, DO [place], [facility] 04/22/2017  Future Appointments  Date Time Provider [place]  04/27/2017  7:45 AM Hayden Pedro, MD [facility] None  04/24/2018  8:20 AM Briscoe Deutscher, DO [facility]

## 2017-04-22 ENCOUNTER — Ambulatory Visit (INDEPENDENT_AMBULATORY_CARE_PROVIDER_SITE_OTHER): Payer: Medicare Other | Admitting: Family Medicine

## 2017-04-22 ENCOUNTER — Encounter: Payer: Self-pay | Admitting: Family Medicine

## 2017-04-22 VITALS — BP 118/70 | HR 82 | Temp 98.6°F | Ht 66.5 in | Wt 148.4 lb

## 2017-04-22 DIAGNOSIS — Z1159 Encounter for screening for other viral diseases: Secondary | ICD-10-CM | POA: Diagnosis not present

## 2017-04-22 DIAGNOSIS — Z0000 Encounter for general adult medical examination without abnormal findings: Secondary | ICD-10-CM

## 2017-04-22 DIAGNOSIS — Z800 Family history of malignant neoplasm of digestive organs: Secondary | ICD-10-CM

## 2017-04-22 DIAGNOSIS — E7800 Pure hypercholesterolemia, unspecified: Secondary | ICD-10-CM | POA: Diagnosis not present

## 2017-04-22 DIAGNOSIS — Z9189 Other specified personal risk factors, not elsewhere classified: Secondary | ICD-10-CM

## 2017-04-22 DIAGNOSIS — M8580 Other specified disorders of bone density and structure, unspecified site: Secondary | ICD-10-CM | POA: Diagnosis not present

## 2017-04-22 LAB — CBC WITH DIFFERENTIAL/PLATELET
Basophils Absolute: 0.1 10*3/uL (ref 0.0–0.1)
Basophils Relative: 1 % (ref 0.0–3.0)
Eosinophils Absolute: 0.1 10*3/uL (ref 0.0–0.7)
Eosinophils Relative: 1.7 % (ref 0.0–5.0)
HCT: 40.1 % (ref 36.0–46.0)
Hemoglobin: 13.7 g/dL (ref 12.0–15.0)
Lymphocytes Relative: 23.8 % (ref 12.0–46.0)
Lymphs Abs: 1.4 10*3/uL (ref 0.7–4.0)
MCHC: 34.1 g/dL (ref 30.0–36.0)
MCV: 94.2 fl (ref 78.0–100.0)
Monocytes Absolute: 0.6 10*3/uL (ref 0.1–1.0)
Monocytes Relative: 10.2 % (ref 3.0–12.0)
Neutro Abs: 3.8 10*3/uL (ref 1.4–7.7)
Neutrophils Relative %: 63.3 % (ref 43.0–77.0)
Platelets: 217 10*3/uL (ref 150.0–400.0)
RBC: 4.25 Mil/uL (ref 3.87–5.11)
RDW: 13.6 % (ref 11.5–15.5)
WBC: 6 10*3/uL (ref 4.0–10.5)

## 2017-04-22 LAB — COMPREHENSIVE METABOLIC PANEL
ALT: 17 U/L (ref 0–35)
AST: 21 U/L (ref 0–37)
Albumin: 3.9 g/dL (ref 3.5–5.2)
Alkaline Phosphatase: 79 U/L (ref 39–117)
BUN: 19 mg/dL (ref 6–23)
CO2: 31 mEq/L (ref 19–32)
Calcium: 9.3 mg/dL (ref 8.4–10.5)
Chloride: 105 mEq/L (ref 96–112)
Creatinine, Ser: 1 mg/dL (ref 0.40–1.20)
GFR: 58.2 mL/min — ABNORMAL LOW (ref >60.00)
Glucose, Bld: 87 mg/dL (ref 70–99)
Potassium: 4.6 mEq/L (ref 3.5–5.1)
Sodium: 141 mEq/L (ref 135–145)
Total Bilirubin: 1.2 mg/dL (ref 0.2–1.2)
Total Protein: 6.7 g/dL (ref 6.0–8.3)

## 2017-04-22 LAB — LIPID PANEL
Cholesterol: 179 mg/dL (ref 0–200)
HDL: 52.9 mg/dL (ref >39.00)
LDL Cholesterol: 86 mg/dL (ref 0–99)
NonHDL: 125.67
Total CHOL/HDL Ratio: 3
Triglycerides: 200 mg/dL — ABNORMAL HIGH (ref 0.0–149.0)
VLDL: 40 mg/dL (ref 0.0–40.0)

## 2017-04-24 LAB — HEPATITIS C ANTIBODY
Hepatitis C Ab: NONREACTIVE
SIGNAL TO CUT-OFF: 0.01 (ref <1.00)

## 2017-04-25 ENCOUNTER — Ambulatory Visit: Payer: Medicare Other | Admitting: *Deleted

## 2017-04-27 ENCOUNTER — Ambulatory Visit (INDEPENDENT_AMBULATORY_CARE_PROVIDER_SITE_OTHER): Payer: Medicare Other | Admitting: Ophthalmology

## 2017-04-27 DIAGNOSIS — H35341 Macular cyst, hole, or pseudohole, right eye: Secondary | ICD-10-CM | POA: Diagnosis not present

## 2017-04-27 DIAGNOSIS — H43812 Vitreous degeneration, left eye: Secondary | ICD-10-CM | POA: Diagnosis not present

## 2017-05-04 ENCOUNTER — Ambulatory Visit (INDEPENDENT_AMBULATORY_CARE_PROVIDER_SITE_OTHER): Payer: Medicare Other | Admitting: Ophthalmology

## 2017-06-19 DIAGNOSIS — R300 Dysuria: Secondary | ICD-10-CM | POA: Diagnosis not present

## 2017-08-02 DIAGNOSIS — L821 Other seborrheic keratosis: Secondary | ICD-10-CM | POA: Diagnosis not present

## 2017-08-02 DIAGNOSIS — D225 Melanocytic nevi of trunk: Secondary | ICD-10-CM | POA: Diagnosis not present

## 2017-09-05 ENCOUNTER — Encounter: Payer: Self-pay | Admitting: Family Medicine

## 2017-09-05 DIAGNOSIS — Z1231 Encounter for screening mammogram for malignant neoplasm of breast: Secondary | ICD-10-CM | POA: Diagnosis not present

## 2017-09-05 LAB — HM MAMMOGRAPHY

## 2017-09-06 DIAGNOSIS — H04123 Dry eye syndrome of bilateral lacrimal glands: Secondary | ICD-10-CM | POA: Diagnosis not present

## 2017-09-06 DIAGNOSIS — Z961 Presence of intraocular lens: Secondary | ICD-10-CM | POA: Diagnosis not present

## 2017-09-06 DIAGNOSIS — H401131 Primary open-angle glaucoma, bilateral, mild stage: Secondary | ICD-10-CM | POA: Diagnosis not present

## 2017-09-06 DIAGNOSIS — H2512 Age-related nuclear cataract, left eye: Secondary | ICD-10-CM | POA: Diagnosis not present

## 2017-09-13 ENCOUNTER — Encounter: Payer: Self-pay | Admitting: Surgical

## 2017-12-29 DIAGNOSIS — N390 Urinary tract infection, site not specified: Secondary | ICD-10-CM | POA: Diagnosis not present

## 2017-12-29 DIAGNOSIS — R300 Dysuria: Secondary | ICD-10-CM | POA: Diagnosis not present

## 2018-03-14 DIAGNOSIS — H2512 Age-related nuclear cataract, left eye: Secondary | ICD-10-CM | POA: Diagnosis not present

## 2018-03-14 DIAGNOSIS — H35341 Macular cyst, hole, or pseudohole, right eye: Secondary | ICD-10-CM | POA: Diagnosis not present

## 2018-03-14 DIAGNOSIS — Z961 Presence of intraocular lens: Secondary | ICD-10-CM | POA: Diagnosis not present

## 2018-03-14 DIAGNOSIS — H401131 Primary open-angle glaucoma, bilateral, mild stage: Secondary | ICD-10-CM | POA: Diagnosis not present

## 2018-03-14 DIAGNOSIS — H04123 Dry eye syndrome of bilateral lacrimal glands: Secondary | ICD-10-CM | POA: Diagnosis not present

## 2018-04-24 ENCOUNTER — Ambulatory Visit: Payer: Medicare Other | Admitting: Family Medicine

## 2018-05-08 ENCOUNTER — Encounter (INDEPENDENT_AMBULATORY_CARE_PROVIDER_SITE_OTHER): Payer: Medicare Other | Admitting: Ophthalmology

## 2018-06-04 DIAGNOSIS — Z6822 Body mass index (BMI) 22.0-22.9, adult: Secondary | ICD-10-CM | POA: Diagnosis not present

## 2018-06-04 DIAGNOSIS — R300 Dysuria: Secondary | ICD-10-CM | POA: Diagnosis not present

## 2018-06-04 DIAGNOSIS — N390 Urinary tract infection, site not specified: Secondary | ICD-10-CM | POA: Diagnosis not present

## 2018-06-27 ENCOUNTER — Other Ambulatory Visit: Payer: Self-pay

## 2018-06-27 ENCOUNTER — Encounter (INDEPENDENT_AMBULATORY_CARE_PROVIDER_SITE_OTHER): Payer: Medicare Other | Admitting: Ophthalmology

## 2018-06-27 DIAGNOSIS — H35341 Macular cyst, hole, or pseudohole, right eye: Secondary | ICD-10-CM | POA: Diagnosis not present

## 2018-06-27 DIAGNOSIS — H43812 Vitreous degeneration, left eye: Secondary | ICD-10-CM

## 2018-06-27 DIAGNOSIS — H2512 Age-related nuclear cataract, left eye: Secondary | ICD-10-CM | POA: Diagnosis not present

## 2018-07-13 ENCOUNTER — Encounter (INDEPENDENT_AMBULATORY_CARE_PROVIDER_SITE_OTHER): Payer: Medicare Other | Admitting: Ophthalmology

## 2018-07-17 ENCOUNTER — Ambulatory Visit: Payer: Self-pay

## 2018-07-17 NOTE — Telephone Encounter (Signed)
Pt c/o left arm tingling and pain. Pt stated the tingling and pain began first of June. She stated that she noted it after she exercise one day. She is having sharp pain under her left axilla that extends to her left tricep. Pt stated that bending over, "turning the wrong way" or reaching for something brings on the pain . Pt stated that she is having no other neurological or cardiac issues. Of note: pt stated the left arm is cooler than the right arm No weakness. Pt has been functioning normally and mowing the lawn this weekend.  Care advice given and pt verbalized understanding. Call transferred to office for appt.    Reason for Disposition . [1] Numbness or tingling in one or both hands AND [2] is a chronic symptom (recurrent or ongoing AND present > 4 weeks)    In left arm only  Answer Assessment - Initial Assessment Questions 1. SYMPTOM: "What is the main symptom you are concerned about?" (e.g., weakness, numbness)     Tingling left arm and stabbing pain upper arm  Dull ache in arm occurred after exercise- bending over reaching causes the stabbing 2. ONSET: "When did this start?" (minutes, hours, days; while sleeping)    First of June 3. LAST NORMAL: "When was the last time you were normal (no symptoms)?"     May 4. PATTERN "Does this come and go, or has it been constant since it started?"  "Is it present now?"     Comes and goes unless she moves the wrong way 5. CARDIAC SYMPTOMS: "Have you had any of the following symptoms: chest pain, difficulty breathing, palpitations?"    no 6. NEUROLOGIC SYMPTOMS: "Have you had any of the following symptoms: headache, dizziness, vision loss, double vision, changes in speech, unsteady on your feet?"     no 7. OTHER SYMPTOMS: "Do you have any other symptoms?"     Pain with movement under left axilla to tricep area; or turning body she feels it - feels cooler than the right arm 8. PREGNANCY: "Is there any chance you are pregnant?" "When was your last  menstrual period?"     n/a  Protocols used: NEUROLOGIC DEFICIT-A-AH

## 2018-07-19 ENCOUNTER — Encounter: Payer: Self-pay | Admitting: Family Medicine

## 2018-07-19 ENCOUNTER — Ambulatory Visit (INDEPENDENT_AMBULATORY_CARE_PROVIDER_SITE_OTHER): Payer: Medicare Other | Admitting: Family Medicine

## 2018-07-19 ENCOUNTER — Other Ambulatory Visit: Payer: Self-pay

## 2018-07-19 VITALS — BP 110/80 | HR 80 | Temp 98.7°F | Ht 66.5 in | Wt 147.0 lb

## 2018-07-19 DIAGNOSIS — M898X1 Other specified disorders of bone, shoulder: Secondary | ICD-10-CM | POA: Diagnosis not present

## 2018-07-19 DIAGNOSIS — Z0000 Encounter for general adult medical examination without abnormal findings: Secondary | ICD-10-CM

## 2018-07-19 DIAGNOSIS — H409 Unspecified glaucoma: Secondary | ICD-10-CM | POA: Diagnosis not present

## 2018-07-19 DIAGNOSIS — M8580 Other specified disorders of bone density and structure, unspecified site: Secondary | ICD-10-CM | POA: Diagnosis not present

## 2018-07-19 DIAGNOSIS — E7800 Pure hypercholesterolemia, unspecified: Secondary | ICD-10-CM

## 2018-07-19 LAB — LIPID PANEL
Cholesterol: 177 mg/dL (ref 0–200)
HDL: 53.6 mg/dL (ref >39.00)
LDL Cholesterol: 96 mg/dL (ref 0–99)
NonHDL: 123.42
Total CHOL/HDL Ratio: 3
Triglycerides: 136 mg/dL (ref 0.0–149.0)
VLDL: 27.2 mg/dL (ref 0.0–40.0)

## 2018-07-19 LAB — COMPREHENSIVE METABOLIC PANEL
ALT: 14 U/L (ref 0–35)
AST: 20 U/L (ref 0–37)
Albumin: 4.2 g/dL (ref 3.5–5.2)
Alkaline Phosphatase: 71 U/L (ref 39–117)
BUN: 23 mg/dL (ref 6–23)
CO2: 29 mEq/L (ref 19–32)
Calcium: 10 mg/dL (ref 8.4–10.5)
Chloride: 105 mEq/L (ref 96–112)
Creatinine, Ser: 1.04 mg/dL (ref 0.40–1.20)
GFR: 52.15 mL/min — ABNORMAL LOW (ref >60.00)
Glucose, Bld: 84 mg/dL (ref 70–99)
Potassium: 4.4 mEq/L (ref 3.5–5.1)
Sodium: 141 mEq/L (ref 135–145)
Total Bilirubin: 0.8 mg/dL (ref 0.2–1.2)
Total Protein: 6.9 g/dL (ref 6.0–8.3)

## 2018-07-19 LAB — CBC WITH DIFFERENTIAL/PLATELET
Basophils Absolute: 0.1 10*3/uL (ref 0.0–0.1)
Basophils Relative: 1 % (ref 0.0–3.0)
Eosinophils Absolute: 0.1 10*3/uL (ref 0.0–0.7)
Eosinophils Relative: 2.4 % (ref 0.0–5.0)
HCT: 40.4 % (ref 36.0–46.0)
Hemoglobin: 13.7 g/dL (ref 12.0–15.0)
Lymphocytes Relative: 34.3 % (ref 12.0–46.0)
Lymphs Abs: 1.7 10*3/uL (ref 0.7–4.0)
MCHC: 33.9 g/dL (ref 30.0–36.0)
MCV: 95.1 fl (ref 78.0–100.0)
Monocytes Absolute: 0.6 10*3/uL (ref 0.1–1.0)
Monocytes Relative: 11.3 % (ref 3.0–12.0)
Neutro Abs: 2.5 10*3/uL (ref 1.4–7.7)
Neutrophils Relative %: 51 % (ref 43.0–77.0)
Platelets: 210 10*3/uL (ref 150.0–400.0)
RBC: 4.25 Mil/uL (ref 3.87–5.11)
RDW: 13 % (ref 11.5–15.5)
WBC: 4.9 10*3/uL (ref 4.0–10.5)

## 2018-07-19 NOTE — Progress Notes (Signed)
Subjective:   The patient is here for annual Medicare wellness examination and management of other chronic and acute problems.   The risk factors are reflected in the social history.  The roster of all physicians providing medical care to patient - is listed in the Snapshot section of the chart.  Activities of daily living:  The patient is 100% independent in all ADLs: dressing, toileting, feeding as well as independent mobility.  Home safety  The patient has smoke detectors in the home. They wear seatbelts.  There are no firearms at home. There is no violence in the home.   There is no risks for hepatitis, STDs, or HIV. There is no history of blood transfusion. They have no travel history to infectious disease endemic areas of the world.  The patient has seen a dentist in the last six months. They have seen their eye doctor in the last year. They admit to slight hearing difficulty with regard to whispered voices and some television programs.  They have deferred audiologic testing in the last year.  They do not  have excessive sun exposure. Discussed the need for sun protection: hats, long sleeves and use of sunscreen if there is significant sun exposure.   The importance of a healthy diet is discussed. They do have a healthy diet.  The benefits of regular aerobic exercise were discussed. Okay to rehab shoulder and get back to strength training as well.  There are no signs or vegative symptoms of depression, including irritability, change in appetite, anhedonia, sadness/tearfullness.  Cognitive assessment: The patient manages all their financial and personal affairs and is actively engaged. They could relate day, date, year and events; recalled 2/3 objects at 3 minutes; performed clock-face test normally.  The following portions of the patient's history were reviewed and updated as appropriate: allergies, current medications, past family history, past medical history, past surgical  history, past social history  and problem list.  Visual acuity was not assessed per patient preference since she has regular follow up with her ophthalmologist. Hearing and body mass index were assessed and reviewed.   CC: The primary encounter diagnosis was Medicare annual wellness visit, subsequent. Diagnoses of Periscapular pain, Osteopenia, unspecified location, Glaucoma, unspecified glaucoma type, unspecified laterality, and Elevated LDL cholesterol level were also pertinent to this visit.  Stacy White has a past medical history of Cataract, Family history of colon cancer, Glaucoma, Osteopenia, and PONV (postoperative nausea and vomiting).   She has a past surgical history that includes Tubal ligation; Tympanoplasty; Colonoscopy ('97,'02,'07,'12); Pars plana vitrectomy (09/30/2011); Gas insertion (09/30/2011); Wisdom tooth extraction; and Eye surgery.   Her family history includes COPD in her mother; Cancer in her father; Colon cancer (age of onset: 55) in her father; Hypertension in her father and mother; Macular degeneration in her mother.  She reports that she has never smoked. She has never used smokeless tobacco. She reports that she does not drink alcohol or use drugs.  Current Outpatient Medications:  .  b complex vitamins tablet, Take 1 tablet by mouth daily., Disp: , Rfl:  .  Calcium Carbonate-Vit D-Min (CALCIUM 1200 PO), Take 2 tablets by mouth daily. , Disp: , Rfl:  .  Cholecalciferol (VITAMIN D) 1000 UNITS capsule, Take 1,000 Units by mouth daily.  , Disp: , Rfl:  .  latanoprost (XALATAN) 0.005 % ophthalmic solution, Place 1 drop into both eyes at bedtime., Disp: , Rfl:  .  Lutein 6 MG CAPS, Take 6 mg by mouth daily. , Disp: ,  Rfl:   Review of Systems  Constitutional: Negative for chills, fever, malaise/fatigue and weight loss.  Respiratory: Negative for cough, shortness of breath and wheezing.   Cardiovascular: Negative for chest pain, palpitations and leg swelling.    Gastrointestinal: Negative for abdominal pain, constipation, diarrhea, nausea and vomiting.  Genitourinary: Negative for dysuria and urgency.  Musculoskeletal: Positive for myalgias and neck pain. Negative for joint pain.  Skin: Negative for rash.  Neurological: Negative for dizziness and headaches.  Psychiatric/Behavioral: Negative for depression, substance abuse and suicidal ideas. The patient is not nervous/anxious.    Objective:   BP 110/80 (BP Location: Left Arm, Patient Position: Sitting, Cuff Size: Normal)   Pulse 80   Temp 98.7 F (37.1 C) (Oral)   Ht 5' 6.5" (1.689 m)   Wt 147 lb (66.7 kg)   SpO2 99%   BMI 23.37 kg/m    Physical Exam Vitals signs and nursing note reviewed.  Constitutional:      General: She is not in acute distress.    Appearance: She is normal weight.  HENT:     Head: Normocephalic and atraumatic.     Right Ear: Tympanic membrane normal.     Left Ear: Tympanic membrane normal.     Nose: Nose normal.     Mouth/Throat:     Mouth: Mucous membranes are moist.  Eyes:     Pupils: Pupils are equal, round, and reactive to light.  Neck:     Musculoskeletal: Normal range of motion and neck supple.  Cardiovascular:     Rate and Rhythm: Normal rate and regular rhythm.     Heart sounds: Normal heart sounds.  Pulmonary:     Effort: Pulmonary effort is normal.  Abdominal:     Palpations: Abdomen is soft.  Musculoskeletal:     Cervical back: She exhibits spasm.       Back:     Right lower leg: No edema.     Left lower leg: No edema.  Skin:    General: Skin is warm.     Capillary Refill: Capillary refill takes less than 2 seconds.  Neurological:     General: No focal deficit present.     Mental Status: She is alert and oriented to person, place, and time.  Psychiatric:        Mood and Affect: Mood normal.        Behavior: Behavior normal.        Thought Content: Thought content normal.        Judgment: Judgment normal.    Assessment and Plan:    Stacy White was seen today for numbness.  Diagnoses and all orders for this visit:  Medicare annual wellness visit, subsequent  Periscapular pain Comments: Strain. Due to incorrect weight lifting form. No red flags. Reviewed rehab exercises and expectations. To PT if not improving.  Orders: -     CBC with Differential/Platelet  Osteopenia, unspecified location  Glaucoma, unspecified glaucoma type, unspecified laterality -     CBC with Differential/Platelet  Elevated LDL cholesterol level -     Comprehensive metabolic panel -     Lipid panel  I am having [name] maintain her Lutein, Vitamin D, Calcium Carbonate-Vit D-Min (CALCIUM 1200 PO), latanoprost, and b complex vitamins.  Briscoe Deutscher, DO  Annual Medicare wellness exam was done as well as a comprehensive physical exam and management of acute and chronic conditions.  During the course of the visit the patient was educated and counseled about appropriate screening  and preventive services including: fall prevention, diabetes screening, nutrition counseling, colorectal cancer screening, and recommended immunizations.  Printed recommendations for health maintenance screenings was given.

## 2018-07-19 NOTE — Patient Instructions (Signed)
Health Maintenance After Age 72 After age 72, you are at a higher risk for certain long-term diseases and infections as well as injuries from falls. Falls are a major cause of broken bones and head injuries in people who are older than age 72. Getting regular preventive care can help to keep you healthy and well. Preventive care includes getting regular testing and making lifestyle changes as recommended by your health care provider. Talk with your health care provider about:  Which screenings and tests you should have. A screening is a test that checks for a disease when you have no symptoms.  A diet and exercise plan that is right for you. What should I know about screenings and tests to prevent falls? Screening and testing are the best ways to find a health problem early. Early diagnosis and treatment give you the best chance of managing medical conditions that are common after age 72. Certain conditions and lifestyle choices may make you more likely to have a fall. Your health care provider may recommend:  Regular vision checks. Poor vision and conditions such as cataracts can make you more likely to have a fall. If you wear glasses, make sure to get your prescription updated if your vision changes.  Medicine review. Work with your health care provider to regularly review all of the medicines you are taking, including over-the-counter medicines. Ask your health care provider about any side effects that may make you more likely to have a fall. Tell your health care provider if any medicines that you take make you feel dizzy or sleepy.  Osteoporosis screening. Osteoporosis is a condition that causes the bones to get weaker. This can make the bones weak and cause them to break more easily.  Blood pressure screening. Blood pressure changes and medicines to control blood pressure can make you feel dizzy.  Strength and balance checks. Your health care provider may recommend certain tests to check your  strength and balance while standing, walking, or changing positions.  Foot health exam. Foot pain and numbness, as well as not wearing proper footwear, can make you more likely to have a fall.  Depression screening. You may be more likely to have a fall if you have a fear of falling, feel emotionally low, or feel unable to do activities that you used to do.  Alcohol use screening. Using too much alcohol can affect your balance and may make you more likely to have a fall. What actions can I take to lower my risk of falls? General instructions  Talk with your health care provider about your risks for falling. Tell your health care provider if: ? You fall. Be sure to tell your health care provider about all falls, even ones that seem minor. ? You feel dizzy, sleepy, or off-balance.  Take over-the-counter and prescription medicines only as told by your health care provider. These include any supplements.  Eat a healthy diet and maintain a healthy weight. A healthy diet includes low-fat dairy products, low-fat (lean) meats, and fiber from whole grains, beans, and lots of fruits and vegetables. Home safety  Remove any tripping hazards, such as rugs, cords, and clutter.  Install safety equipment such as grab bars in bathrooms and safety rails on stairs.  Keep rooms and walkways well-lit. Activity   Follow a regular exercise program to stay fit. This will help you maintain your balance. Ask your health care provider what types of exercise are appropriate for you.  If you need a cane or   walker, use it as recommended by your health care provider.  Wear supportive shoes that have nonskid soles. Lifestyle  Do not drink alcohol if your health care provider tells you not to drink.  If you drink alcohol, limit how much you have: ? 0-1 drink a day for women. ? 0-2 drinks a day for men.  Be aware of how much alcohol is in your drink. In the [place] one drink equals one typical bottle of beer (12  oz), one-half glass of wine (5 oz), or one shot of hard liquor (1 oz).  Do not use any products that contain nicotine or tobacco, such as cigarettes and e-cigarettes. If you need help quitting, ask your health care provider. Summary  Having a healthy lifestyle and getting preventive care can help to protect your health and wellness after age 72.  Screening and testing are the best way to find a health problem early and help you avoid having a fall. Early diagnosis and treatment give you the best chance for managing medical conditions that are more common for people who are older than age 72.  Falls are a major cause of broken bones and head injuries in people who are older than age 72. Take precautions to prevent a fall at home.  Work with your health care provider to learn what changes you can make to improve your health and wellness and to prevent falls. This information is not intended to replace advice given to you by your health care provider. Make sure you discuss any questions you have with your health care provider. Document Released: 11/17/2016 Document Revised: 04/27/2018 Document Reviewed: 11/17/2016 Elsevier Patient Education  2020 [employer]  

## 2018-08-03 DIAGNOSIS — H35341 Macular cyst, hole, or pseudohole, right eye: Secondary | ICD-10-CM | POA: Diagnosis not present

## 2018-08-03 DIAGNOSIS — H2512 Age-related nuclear cataract, left eye: Secondary | ICD-10-CM | POA: Diagnosis not present

## 2018-08-03 DIAGNOSIS — Z961 Presence of intraocular lens: Secondary | ICD-10-CM | POA: Diagnosis not present

## 2018-08-03 DIAGNOSIS — H401131 Primary open-angle glaucoma, bilateral, mild stage: Secondary | ICD-10-CM | POA: Diagnosis not present

## 2018-08-03 DIAGNOSIS — H04123 Dry eye syndrome of bilateral lacrimal glands: Secondary | ICD-10-CM | POA: Diagnosis not present

## 2018-08-03 DIAGNOSIS — H31091 Other chorioretinal scars, right eye: Secondary | ICD-10-CM | POA: Diagnosis not present

## 2018-08-08 DIAGNOSIS — L720 Epidermal cyst: Secondary | ICD-10-CM | POA: Diagnosis not present

## 2018-08-08 DIAGNOSIS — L821 Other seborrheic keratosis: Secondary | ICD-10-CM | POA: Diagnosis not present

## 2018-08-08 DIAGNOSIS — D1801 Hemangioma of skin and subcutaneous tissue: Secondary | ICD-10-CM | POA: Diagnosis not present

## 2018-09-11 DIAGNOSIS — M8589 Other specified disorders of bone density and structure, multiple sites: Secondary | ICD-10-CM | POA: Diagnosis not present

## 2018-09-11 DIAGNOSIS — Z1231 Encounter for screening mammogram for malignant neoplasm of breast: Secondary | ICD-10-CM | POA: Diagnosis not present

## 2018-09-11 LAB — HM MAMMOGRAPHY

## 2018-09-11 LAB — HM DEXA SCAN

## 2018-11-10 DIAGNOSIS — R350 Frequency of micturition: Secondary | ICD-10-CM | POA: Diagnosis not present

## 2018-11-22 ENCOUNTER — Telehealth: Payer: Self-pay

## 2018-11-22 NOTE — Telephone Encounter (Signed)
Patient notified no records received,she will contact [facility].

## 2018-11-22 NOTE — Telephone Encounter (Signed)
Copied from [place] [id number]. Topic: General - Other >> Nov 22, 2018  9:38 AM Sheran Luz wrote: Patient calling to inquire if her medical records have been received. She states [facility] was supposed to send them.

## 2018-11-28 ENCOUNTER — Encounter: Payer: Self-pay | Admitting: Family Medicine

## 2018-11-28 ENCOUNTER — Ambulatory Visit (INDEPENDENT_AMBULATORY_CARE_PROVIDER_SITE_OTHER): Payer: Medicare Other | Admitting: Family Medicine

## 2018-11-28 ENCOUNTER — Other Ambulatory Visit: Payer: Self-pay

## 2018-11-28 VITALS — BP 118/72 | HR 82 | Temp 97.6°F | Ht 66.5 in | Wt 150.0 lb

## 2018-11-28 DIAGNOSIS — H409 Unspecified glaucoma: Secondary | ICD-10-CM | POA: Diagnosis not present

## 2018-11-28 DIAGNOSIS — E7800 Pure hypercholesterolemia, unspecified: Secondary | ICD-10-CM

## 2018-11-28 DIAGNOSIS — M8580 Other specified disorders of bone density and structure, unspecified site: Secondary | ICD-10-CM | POA: Diagnosis not present

## 2018-11-28 DIAGNOSIS — N390 Urinary tract infection, site not specified: Secondary | ICD-10-CM | POA: Diagnosis not present

## 2018-11-28 NOTE — Progress Notes (Signed)
   Chief Complaint:  Stacy White is a 73 y.o. female who presents today with a chief complaint of UTI symptoms and to transfer care.   Assessment/Plan:  UTI No red flags.  We will continue with watchful waiting.  Discussed preventative measures including good oral hydration, and hygienic practices.  If continues to have recurrent UTI, would consider referral to urology.  Elevated LDL cholesterol level Check lipid panel next blood draw.  Glaucoma Stable.  Continue management per ophthalmology.  Osteopenia Had a lengthy discussion with patient regarding her bone density scores and optimal treatment.  She will continue calcium and vitamin D supplementation.  Due for repeat DEXA in 2022.  Would consider bisphosphonate depending on T score.     Subjective:  HPI:  Patient had UTI symptoms started 2 weeks ago.  She was seen in urgent care.  Found to have UTI.  Started on Macrobid.  Symptoms have resolved.  She was told that she needs to get an ultrasound done as she has had 3 UTIs since last December.  Does not currently have any symptoms.  Her stable, chronic medical conditions are outlined below:  # Osteopenia - On calcium and vitamin D supplementation   # Dyslipidemia - diet controlled   % Glaucoma - Follows with ophthalmology - Dr Rodena Piety and Dr Katy Fitch  ROS: Per HPI  PMH: She reports that she has never smoked. She has never used smokeless tobacco. She reports that she does not drink alcohol or use drugs.      Objective:  Physical Exam: BP 118/72   Pulse 82   Temp 97.6 F (36.4 C)   Ht 5' 6.5" (1.689 m)   Wt 150 lb (68 kg)   SpO2 97%   BMI 23.85 kg/m   Gen: NAD, resting comfortably CV: Regular rate and rhythm with no murmurs appreciated Pulm: Normal work of breathing, clear to auscultation bilaterally with no crackles, wheezes, or rhonchi GI: Normal bowel sounds present. Soft, Nontender, Nondistended. MSK: No edema, cyanosis, or clubbing noted Skin: Warm, dry  Neuro: Grossly normal, moves all extremities Psych: Normal affect and thought content  Time Spent: I spent >40 minutes face-to-face with the patient, with more than half spent on counseling for management plan for her recurrent UTI, dyslipidemia, glaucoma, and osteopenia.      Algis Greenhouse. Jerline Pain, MD 11/28/2018 3:29 PM

## 2018-11-28 NOTE — Assessment & Plan Note (Signed)
Had a lengthy discussion with patient regarding her bone density scores and optimal treatment.  She will continue calcium and vitamin D supplementation.  Due for repeat DEXA in 2022.  Would consider bisphosphonate depending on T score.

## 2018-11-28 NOTE — Assessment & Plan Note (Signed)
Stable.  Continue management per ophthalmology. 

## 2018-11-28 NOTE — Patient Instructions (Addendum)
It was very nice to see you today!  Please let me know if your UTI symptoms come back.  Come back to see me in August for your annual check up, or sooner if needd.   Take care, Dr Jerline Pain  Please try these tips to maintain a healthy lifestyle:   Eat at least 3 REAL meals and 1-2 snacks per day.  Aim for no more than 5 hours between eating.  If you eat breakfast, please do so within one hour of getting up.    Obtain twice as many fruits/vegetables as protein or carbohydrate foods for both lunch and dinner. (Half of each meal should be fruits/vegetables, one quarter protein, and one quarter starchy carbs)   Cut down on sweet beverages. This includes juice, soda, and sweet tea.    Exercise at least 150 minutes every week.

## 2018-11-28 NOTE — Assessment & Plan Note (Signed)
Check lipid panel next blood draw.  

## 2019-06-28 ENCOUNTER — Encounter (INDEPENDENT_AMBULATORY_CARE_PROVIDER_SITE_OTHER): Payer: Medicare Other | Admitting: Ophthalmology

## 2019-07-19 ENCOUNTER — Encounter (INDEPENDENT_AMBULATORY_CARE_PROVIDER_SITE_OTHER): Payer: Medicare Other | Admitting: Ophthalmology

## 2019-07-19 ENCOUNTER — Other Ambulatory Visit: Payer: Self-pay

## 2019-07-19 DIAGNOSIS — H43813 Vitreous degeneration, bilateral: Secondary | ICD-10-CM

## 2019-07-19 DIAGNOSIS — H35341 Macular cyst, hole, or pseudohole, right eye: Secondary | ICD-10-CM

## 2019-07-20 ENCOUNTER — Encounter: Payer: Medicare Other | Admitting: Family Medicine

## 2019-08-02 ENCOUNTER — Other Ambulatory Visit: Payer: Self-pay

## 2019-08-02 ENCOUNTER — Ambulatory Visit (INDEPENDENT_AMBULATORY_CARE_PROVIDER_SITE_OTHER): Payer: Medicare Other

## 2019-08-02 DIAGNOSIS — Z0000 Encounter for general adult medical examination without abnormal findings: Secondary | ICD-10-CM

## 2019-08-02 NOTE — Patient Instructions (Addendum)
[name] , Thank you for taking time to come for your Medicare Wellness Visit. I appreciate your ongoing commitment to your health goals. Please review the following plan we discussed and let me know if I can assist you in the future.   Screening recommendations/referrals: Colonoscopy: Done 11/25/15 Mammogram: Done 09/11/18 Bone Density: Done 09/11/18 Recommended yearly ophthalmology/optometry visit for glaucoma screening and checkup Recommended yearly dental visit for hygiene and checkup  Vaccinations: Influenza vaccine: Due in season Pneumococcal vaccine: Declined Tdap vaccine: Up to date Shingles vaccine: Completed   Covid-19:Declined  Advanced directives: Please bring a copy of your health care power of attorney and living will to the office at your convenience.   Conditions/risks identified: Stay active  Next appointment: Follow up in one year for your annual wellness visit     Preventive Care 65 Years and Older, Female Preventive care refers to lifestyle choices and visits with your health care provider that can promote health and wellness. What does preventive care include?  A yearly physical exam. This is also called an annual well check.  Dental exams once or twice a year.  Routine eye exams. Ask your health care provider how often you should have your eyes checked.  Personal lifestyle choices, including:  Daily care of your teeth and gums.  Regular physical activity.  Eating a healthy diet.  Avoiding tobacco and drug use.  Limiting alcohol use.  Practicing safe sex.  Taking low-dose aspirin every day.  Taking vitamin and mineral supplements as recommended by your health care provider. What happens during an annual well check? The services and screenings done by your health care provider during your annual well check will depend on your age, overall health, lifestyle risk factors, and family history of disease. Counseling  Your health care provider may  ask you questions about your:  Alcohol use.  Tobacco use.  Drug use.  Emotional well-being.  Home and relationship well-being.  Sexual activity.  Eating habits.  History of falls.  Memory and ability to understand (cognition).  Work and work Statistician.  Reproductive health. Screening  You may have the following tests or measurements:  Height, weight, and BMI.  Blood pressure.  Lipid and cholesterol levels. These may be checked every 5 years, or more frequently if you are over 15 years old.  Skin check.  Lung cancer screening. You may have this screening every year starting at age 14 if you have a 30-pack-year history of smoking and currently smoke or have quit within the past 15 years.  Fecal occult blood test (FOBT) of the stool. You may have this test every year starting at age 73.  Flexible sigmoidoscopy or colonoscopy. You may have a sigmoidoscopy every 5 years or a colonoscopy every 10 years starting at age 20.  Hepatitis C blood test.  Hepatitis B blood test.  Sexually transmitted disease (STD) testing.  Diabetes screening. This is done by checking your blood sugar (glucose) after you have not eaten for a while (fasting). You may have this done every 1-3 years.  Bone density scan. This is done to screen for osteoporosis. You may have this done starting at age 20.  Mammogram. This may be done every 1-2 years. Talk to your health care provider about how often you should have regular mammograms. Talk with your health care provider about your test results, treatment options, and if necessary, the need for more tests. Vaccines  Your health care provider may recommend certain vaccines, such as:  Influenza vaccine.  This is recommended every year.  Tetanus, diphtheria, and acellular pertussis (Tdap, Td) vaccine. You may need a Td booster every 10 years.  Zoster vaccine. You may need this after age 62.  Pneumococcal 13-valent conjugate (PCV13) vaccine. One  dose is recommended after age 22.  Pneumococcal polysaccharide (PPSV23) vaccine. One dose is recommended after age 36. Talk to your health care provider about which screenings and vaccines you need and how often you need them. This information is not intended to replace advice given to you by your health care provider. Make sure you discuss any questions you have with your health care provider. Document Released: 01/31/2015 Document Revised: 09/24/2015 Document Reviewed: 11/05/2014 Elsevier Interactive Patient Education  2017 [employer] Prevention in the Home Falls can cause injuries. They can happen to people of all ages. There are many things you can do to make your home safe and to help prevent falls. What can I do on the outside of my home?  Regularly fix the edges of walkways and driveways and fix any cracks.  Remove anything that might make you trip as you walk through a door, such as a raised step or threshold.  Trim any bushes or trees on the path to your home.  Use bright outdoor lighting.  Clear any walking paths of anything that might make someone trip, such as rocks or tools.  Regularly check to see if handrails are loose or broken. Make sure that both sides of any steps have handrails.  Any raised decks and porches should have guardrails on the edges.  Have any leaves, snow, or ice cleared regularly.  Use sand or salt on walking paths during winter.  Clean up any spills in your garage right away. This includes oil or grease spills. What can I do in the bathroom?  Use night lights.  Install grab bars by the toilet and in the tub and shower. Do not use towel bars as grab bars.  Use non-skid mats or decals in the tub or shower.  If you need to sit down in the shower, use a plastic, non-slip stool.  Keep the floor dry. Clean up any water that spills on the floor as soon as it happens.  Remove soap buildup in the tub or shower regularly.  Attach bath  mats securely with double-sided non-slip rug tape.  Do not have throw rugs and other things on the floor that can make you trip. What can I do in the bedroom?  Use night lights.  Make sure that you have a light by your bed that is easy to reach.  Do not use any sheets or blankets that are too big for your bed. They should not hang down onto the floor.  Have a firm chair that has side arms. You can use this for support while you get dressed.  Do not have throw rugs and other things on the floor that can make you trip. What can I do in the kitchen?  Clean up any spills right away.  Avoid walking on wet floors.  Keep items that you use a lot in easy-to-reach places.  If you need to reach something above you, use a strong step stool that has a grab bar.  Keep electrical cords out of the way.  Do not use floor polish or wax that makes floors slippery. If you must use wax, use non-skid floor wax.  Do not have throw rugs and other things on the floor that can make  you trip. What can I do with my stairs?  Do not leave any items on the stairs.  Make sure that there are handrails on both sides of the stairs and use them. Fix handrails that are broken or loose. Make sure that handrails are as long as the stairways.  Check any carpeting to make sure that it is firmly attached to the stairs. Fix any carpet that is loose or worn.  Avoid having throw rugs at the top or bottom of the stairs. If you do have throw rugs, attach them to the floor with carpet tape.  Make sure that you have a light switch at the top of the stairs and the bottom of the stairs. If you do not have them, ask someone to add them for you. What else can I do to help prevent falls?  Wear shoes that:  Do not have high heels.  Have rubber bottoms.  Are comfortable and fit you well.  Are closed at the toe. Do not wear sandals.  If you use a stepladder:  Make sure that it is fully opened. Do not climb a closed  stepladder.  Make sure that both sides of the stepladder are locked into place.  Ask someone to hold it for you, if possible.  Clearly mark and make sure that you can see:  Any grab bars or handrails.  First and last steps.  Where the edge of each step is.  Use tools that help you move around (mobility aids) if they are needed. These include:  Canes.  Walkers.  Scooters.  Crutches.  Turn on the lights when you go into a dark area. Replace any light bulbs as soon as they burn out.  Set up your furniture so you have a clear path. Avoid moving your furniture around.  If any of your floors are uneven, fix them.  If there are any pets around you, be aware of where they are.  Review your medicines with your doctor. Some medicines can make you feel dizzy. This can increase your chance of falling. Ask your doctor what other things that you can do to help prevent falls. This information is not intended to replace advice given to you by your health care provider. Make sure you discuss any questions you have with your health care provider. Document Released: 10/31/2008 Document Revised: 06/12/2015 Document Reviewed: 02/08/2014 Elsevier Interactive Patient Education  2017 [employer].

## 2019-08-02 NOTE — Progress Notes (Addendum)
Virtual Visit via Telephone Note  I connected with  Stacy White on 08/02/19 at  8:45 AM EDT by telephone and verified that I am speaking with the correct person using two identifiers.  Medicare Annual Wellness visit completed telephonically due to Covid-19 pandemic.   Persons participating in this call: This Health Coach and this patient.   Location: Patient: Home Provider: Office   I discussed the limitations, risks, security and privacy concerns of performing an evaluation and management service by telephone and the availability of in person appointments. The patient expressed understanding and agreed to proceed.  Unable to perform video visit due to video visit attempted and failed and/or patient does not have video capability.   Some vital signs may be absent or patient reported.   Willette Brace, LPN    Subjective:   Stacy White is a 74 y.o. female who presents for Medicare Annual (Subsequent) preventive examination.  Review of Systems     Cardiac Risk Factors include: advanced age (>104men, >56 women)     Objective:    There were no vitals filed for this visit. There is no height or weight on file to calculate BMI.  Advanced Directives 08/02/2019 07/19/2018 04/22/2016 11/25/2015 10/20/2015 10/01/2011 09/30/2011  Does Patient Have a Medical Advance Directive? Yes Yes Yes Yes Yes - Patient does not have advance directive  Type of Advance Directive [place];Living will Living will;Healthcare Power of Attorney Living will;Healthcare Power of [place];Living will [place];Living will - -  Copy of [place] in Chart? No - copy requested - No - copy requested - No - copy requested - -  Pre-existing out of facility DNR order (yellow form or pink MOST form) - - - - - No -    Current Medications (verified) Outpatient Encounter Medications as of 08/02/2019  Medication Sig  . aspirin 325 MG tablet  Take 325 mg by mouth once. Every monday  . b complex vitamins tablet Take 1 tablet by mouth daily.  . Calcium Carbonate-Vit D-Min (CALCIUM 1200 PO) Take 2 tablets by mouth daily.   . Cholecalciferol (VITAMIN D) 1000 UNITS capsule Take 1,000 Units by mouth daily.    Marland Kitchen latanoprost (XALATAN) 0.005 % ophthalmic solution Place 1 drop into both eyes at bedtime.  . Lutein 6 MG CAPS Take 6 mg by mouth daily.    No facility-administered encounter medications on file as of 08/02/2019.    Allergies (verified) Patient has no known allergies.   History: Past Medical History:  Diagnosis Date  . Cataract   . Family history of colon cancer    next colonoscopy due 2017  . Glaucoma   . Osteopenia   . PONV (postoperative nausea and vomiting)    last anethesia in the 80s   Past Surgical History:  Procedure Laterality Date  . COLONOSCOPY  '97,'02,'07,'12   2012: Polypectomy.  Mild sigmoid diverticulosis (Dr. Vito Backers 2017  . EYE SURGERY    . GAS INSERTION  09/30/2011   Procedure: INSERTION OF GAS;  Surgeon: Hayden Pedro, MD;  Location: [facility];  Service: Ophthalmology;  Laterality: Right;  [id number]  . PARS PLANA VITRECTOMY  09/30/2011   Procedure: PARS PLANA VITRECTOMY WITH 25 GAUGE;  Surgeon: Hayden Pedro, MD;  Location: [facility];  Service: Ophthalmology;  Laterality: Right;  Macular Hole  . TUBAL LIGATION    . TYMPANOPLASTY    . WISDOM TOOTH EXTRACTION     x2  Family History  Problem Relation Age of Onset  . Hypertension Mother   . COPD Mother   . Macular degeneration Mother   . Cancer Father        colon  . Hypertension Father   . Colon cancer Father 61       died from colon ca  . Stomach cancer Neg Hx    Social History   Socioeconomic History  . Marital status: Married    Spouse name: Not on file  . Number of children: Not on file  . Years of education: Not on file  . Highest education level: Not on file  Occupational History  . Occupation: Retired  Tobacco Use  .  Smoking status: Never Smoker  . Smokeless tobacco: Never Used  Substance and Sexual Activity  . Alcohol use: No  . Drug use: No  . Sexual activity: Never    Birth control/protection: Abstinence  Other Topics Concern  . Not on file  Social History Narrative   Married, 2 children, 3 grandchildren.   Orig from [place].  Has lived in [place] since 1988.   Occupation: retired Oncologist business.   No tob/alc/drugs.   Ex[facility]     Working on "10,000" steps a day.   Social Determinants of Health   Financial Resource Strain: Low Risk   . Difficulty of Paying Living Expenses: Not hard at all  Food Insecurity: No Food Insecurity  . Worried About Charity fundraiser in the Last Year: Never true  . Ran Out of Food in the Last Year: Never true  Transportation Needs: No Transportation Needs  . Lack of Transportation (Medical): No  . Lack of Transportation (Non-Medical): No  Physical Activity: Sufficiently Active  . Days of Exercise per Week: 5 days  . Minutes of Exercise per Session: 60 min  Stress: No Stress Concern Present  . Feeling of Stress : Not at all  Social Connections: Socially Integrated  . Frequency of Communication with Friends and Family: More than three times a week  . Frequency of Social Gatherings with Friends and Family: Never  . Attends Religious Services: More than 4 times per year  . Active Member of Clubs or Organizations: Yes  . Attends Archivist Meetings: More than 4 times per year  . Marital Status: Married    Tobacco Counseling Counseling given: Not Answered   Clinical Intake:  Pre-visit preparation completed: Yes  Pain : No/denies pain     BMI - recorded: 23.85 Nutritional Status: BMI of 19-24  Normal Nutritional Risks: None Diabetes: No  How often do you need to have someone help you when you read instructions, pamphlets, or other written materials  from your doctor or pharmacy?: 1 - Never  Diabetic?No  Interpreter Needed?: No  Information entered by :: Stacy Rakes, LPN   Activities of Daily Living In your present state of health, do you have any difficulty performing the following activities: 08/02/2019  Hearing? N  Vision? N  Difficulty concentrating or making decisions? N  Walking or climbing stairs? N  Dressing or bathing? N  Doing errands, shopping? N  Preparing Food and eating ? N  Using the Toilet? N  In the past six months, have you accidently leaked urine? N  Comment uses a panty liner as a protection  Do you have problems with loss of bowel control? Y  Comment if eat any chocolate  and or candy can have an accident  Managing your Medications? N  Managing your Finances? N  Housekeeping or managing your Housekeeping? N  Some recent data might be hidden    Patient Care Team: Vivi Barrack, MD as PCP - General (Family Medicine) Pyrtle, Lajuan Lines, MD as Consulting Physician (Gastroenterology) Clent Jacks, MD as Consulting Physician (Ophthalmology) Hayden Pedro, MD as Consulting Physician (Ophthalmology)  Indicate any recent Medical Services you may have received from other than [facility] providers in the past year (date may be approximate).     Assessment:   This is a routine wellness examination for [employer].  Hearing/Vision screen  Hearing Screening   125Hz  250Hz  500Hz  1000Hz  2000Hz  3000Hz  4000Hz  6000Hz  8000Hz   Right ear:           Left ear:           Comments: Pt states mild loss in right ear due to injury,   Vision Screening Comments: Pt wears eyeglasses and follows up annually/every 6 months Dr Clent Jacks    Dietary issues and exercise activities discussed: Current Exercise Habits: Home exercise routine, Type of exercise: walking, Time (Minutes): 60, Frequency (Times/Week): 5, Weekly Exercise (Minutes/Week): 300, Intensity: Moderate, Exercise limited by: None identified  Goals    . Decrease sweets      . DIET - INCREASE WATER INTAKE    . Exercise 150 min/wk Moderate Activity    . Patient Stated     Stay active as Can      Depression Screen PHQ 2/9 Scores 08/02/2019 07/19/2018 07/19/2018 04/22/2016 07/16/2014  PHQ - 2 Score 0 0 0 0 0  PHQ- 9 Score - 0 0 - -  Exception Documentation - - - - Patient refusal    Fall Risk Fall Risk  08/02/2019 07/19/2018 07/19/2018 04/22/2016 07/16/2014  Falls in the past year? 0 0 0 No No  Number falls in past yr: 0 - - - -  Injury with Fall? 0 - - - -  Risk for fall due to : Impaired vision - - - -  Follow up Falls prevention discussed - - - -    Any stairs in or around the home? Yes  If so, are there any without handrails? No  Home free of loose throw rugs in walkways, pet beds, electrical cords, etc? Yes  Adequate lighting in your home to reduce risk of falls? Yes   ASSISTIVE DEVICES UTILIZED TO PREVENT FALLS:  Life alert? No  Use of a cane, walker or w/c? No  Grab bars in the bathroom? No  Shower chair or bench in shower? No  Elevated toilet seat or a handicapped toilet? No   TIMED UP AND White:  Was the test performed? No .      Cognitive Function:     6CIT Screen 08/02/2019  What Year? 0 points  What month? 0 points  What time? 0 points  Count back from 20 0 points  Months in reverse 0 points  Repeat phrase 0 points  Total Score 0    Immunizations Immunization History  Administered Date(s) Administered  . Tdap 07/01/2010  . Zoster 07/11/2014  . Zoster Recombinat (Shingrix) 05/14/2017, 08/26/2017    TDAP status: Up to date Flu Vaccine status: Up to date Pneumococcal vaccine status: Declined,  Education has been provided regarding the importance of this vaccine but patient still declined. Advised may receive this vaccine at local pharmacy or Health Dept. Aware to provide a copy of the vaccination record  if obtained from local pharmacy or Health Dept. Verbalized acceptance and understanding.  Covid-19 vaccine status: Declined,  Education has been provided regarding the importance of this vaccine but patient still declined. Advised may receive this vaccine at local pharmacy or Health Dept.or vaccine clinic. Aware to provide a copy of the vaccination record if obtained from local pharmacy or Health Dept. Verbalized acceptance and understanding.  Qualifies for Shingles Vaccine? Yes   Zostavax completed Yes   Shingrix Completed?: Yes  Screening Tests Health Maintenance  Topic Date Due  . COVID-19 Vaccine (1) Never done  . INFLUENZA VACCINE  08/19/2019  . TETANUS/TDAP  06/30/2020  . MAMMOGRAM  09/10/2020  . COLONOSCOPY  11/24/2020  . DEXA SCAN  Completed  . Hepatitis C Screening  Completed  . PNA vac Low Risk Adult  Discontinued    Health Maintenance  Health Maintenance Due  Topic Date Due  . COVID-19 Vaccine (1) Never done    Colorectal cancer screening: Completed 11/25/2015. Repeat every 5 years Mammogram status: Completed 09/11/18. Repeat every year Bone Density status: Completed 09/11/18. Results reflect: Bone density results: OSTEOPENIA. Repeat every 2 years.   Additional Screening:  Hepatitis C Screening: Completed 04/22/17  Vision Screening: Recommended annual ophthalmology exams for early detection of glaucoma and other disorders of the eye. Is the patient up to date with their annual eye exam?  Yes  Who is the provider or what is the name of the office in which the patient attends annual eye exams? Dr. Clent Jacks  Dental Screening: Recommended annual dental exams for proper oral hygiene  Community Resource Referral / Chronic Care Management: CRR required this visit?  No   CCM required this visit?  No      Plan:     I have personally reviewed and noted the following in the patient's chart:   . Medical and social history . Use of alcohol, tobacco or illicit drugs  . Current medications and supplements . Functional ability and status . Nutritional status . Physical  activity . Advanced directives . List of other physicians . Hospitalizations, surgeries, and ER visits in previous 12 months . Vitals . Screenings to include cognitive, depression, and falls . Referrals and appointments  In addition, I have reviewed and discussed with patient certain preventive protocols, quality metrics, and best practice recommendations. A written personalized care plan for preventive services as well as general preventive health recommendations were provided to patient.     Willette Brace, LPN   5/73/2202   Nurse Notes: None

## 2019-08-28 ENCOUNTER — Ambulatory Visit: Payer: Medicare Other | Admitting: Family Medicine

## 2019-08-29 ENCOUNTER — Ambulatory Visit (INDEPENDENT_AMBULATORY_CARE_PROVIDER_SITE_OTHER): Payer: Medicare Other | Admitting: Family Medicine

## 2019-08-29 ENCOUNTER — Encounter: Payer: Self-pay | Admitting: Family Medicine

## 2019-08-29 ENCOUNTER — Other Ambulatory Visit: Payer: Self-pay

## 2019-08-29 VITALS — BP 110/74 | HR 75 | Temp 98.2°F | Ht 66.5 in | Wt 145.2 lb

## 2019-08-29 DIAGNOSIS — R059 Cough, unspecified: Secondary | ICD-10-CM

## 2019-08-29 DIAGNOSIS — H409 Unspecified glaucoma: Secondary | ICD-10-CM

## 2019-08-29 DIAGNOSIS — E7800 Pure hypercholesterolemia, unspecified: Secondary | ICD-10-CM

## 2019-08-29 DIAGNOSIS — R05 Cough: Secondary | ICD-10-CM | POA: Diagnosis not present

## 2019-08-29 DIAGNOSIS — R208 Other disturbances of skin sensation: Secondary | ICD-10-CM

## 2019-08-29 DIAGNOSIS — M8580 Other specified disorders of bone density and structure, unspecified site: Secondary | ICD-10-CM

## 2019-08-29 NOTE — Assessment & Plan Note (Signed)
Check lipid panel, CBC, CMET, TSH.

## 2019-08-29 NOTE — Assessment & Plan Note (Signed)
Stable.  Continue management per ophthalmology. 

## 2019-08-29 NOTE — Assessment & Plan Note (Signed)
Due for DEXA scan next year.

## 2019-08-29 NOTE — Progress Notes (Signed)
   Stacy White is a 74 y.o. female who presents today for an office visit.  Assessment/Plan:  New/Acute Problems: Allodynia Not currently having any symptoms.  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  Will continue with watchful waiting.  Discussed reasons to return to care.  Chronic Problems Addressed Today: Elevated LDL cholesterol level Check lipid panel, CBC, CMET, TSH.  Glaucoma Stable.  Continue management per ophthalmology.  Osteopenia Due for DEXA scan next year.  Preventative Healthcare Up-to-date on mammogram and colon cancer screening.  Discussed Covid vaccine.  She is up-to-date on other vaccines.  Will check labs today.  Follow-up in 1 year.    Subjective:  HPI:  Patient here for annual follow-up.  Last seen about 9 months ago for UTI.  Thankfully has not had any issues since then.  She has had recurrent burning sensation on the right flank for the past month or so.  Feels similar to prior shingles outbreaks.  No rash.       Objective:  Physical Exam: BP 110/74   Pulse 75   Temp 98.2 F (36.8 C)   Ht 5' 6.5" (1.689 m)   Wt 145 lb 3.2 oz (65.9 kg)   SpO2 98%   BMI 23.08 kg/m   Gen: No acute distress, resting comfortably CV: Regular rate and rhythm with no murmurs appreciated Pulm: Normal work of breathing, clear to auscultation bilaterally with no crackles, wheezes, or rhonchi Neuro: Grossly normal, moves all extremities Psych: Normal affect and thought content      Amyrie Illingworth M. Jerline Pain, MD 08/29/2019 10:21 AM

## 2019-08-29 NOTE — Patient Instructions (Signed)
It was very nice to see you today!  We will check blood work today.  I will see back in year.  Please come back to see me sooner if needed.  Take care, Dr Jerline Pain  Please try these tips to maintain a healthy lifestyle:   Eat at least 3 REAL meals and 1-2 snacks per day.  Aim for no more than 5 hours between eating.  If you eat breakfast, please do so within one hour of getting up.    Each meal should contain half fruits/vegetables, one quarter protein, and one quarter carbs (no bigger than a computer mouse)   Cut down on sweet beverages. This includes juice, soda, and sweet tea.     Drink at least 1 glass of water with each meal and aim for at least 8 glasses per day   Exercise at least 150 minutes every week.

## 2019-08-30 LAB — LIPID PANEL
Cholesterol: 235 mg/dL — ABNORMAL HIGH (ref <200)
HDL: 61 mg/dL (ref >=50)
LDL Cholesterol (Calc): 143 mg/dL (calc) — ABNORMAL HIGH
Non-HDL Cholesterol (Calc): 174 mg/dL (calc) — ABNORMAL HIGH (ref <130)
Total CHOL/HDL Ratio: 3.9 (calc) (ref <5.0)
Triglycerides: 175 mg/dL — ABNORMAL HIGH (ref <150)

## 2019-08-30 LAB — COMPREHENSIVE METABOLIC PANEL
AG Ratio: 1.6 (calc) (ref 1.0–2.5)
ALT: 15 U/L (ref 6–29)
AST: 22 U/L (ref 10–35)
Albumin: 4.5 g/dL (ref 3.6–5.1)
Alkaline phosphatase (APISO): 71 U/L (ref 37–153)
BUN/Creatinine Ratio: 22 (calc) (ref 6–22)
BUN: 24 mg/dL (ref 7–25)
CO2: 30 mmol/L (ref 20–32)
Calcium: 10.8 mg/dL — ABNORMAL HIGH (ref 8.6–10.4)
Chloride: 102 mmol/L (ref 98–110)
Creat: 1.1 mg/dL — ABNORMAL HIGH (ref 0.60–0.93)
Globulin: 2.9 g/dL (calc) (ref 1.9–3.7)
Glucose, Bld: 95 mg/dL (ref 65–99)
Potassium: 4.3 mmol/L (ref 3.5–5.3)
Sodium: 139 mmol/L (ref 135–146)
Total Bilirubin: 1.2 mg/dL (ref 0.2–1.2)
Total Protein: 7.4 g/dL (ref 6.1–8.1)

## 2019-08-30 LAB — CBC
HCT: 40.9 % (ref 35.0–45.0)
Hemoglobin: 13.9 g/dL (ref 11.7–15.5)
MCH: 32.1 pg (ref 27.0–33.0)
MCHC: 34 g/dL (ref 32.0–36.0)
MCV: 94.5 fL (ref 80.0–100.0)
MPV: 10.4 fL (ref 7.5–12.5)
Platelets: 228 10*3/uL (ref 140–400)
RBC: 4.33 10*6/uL (ref 3.80–5.10)
RDW: 12.2 % (ref 11.0–15.0)
WBC: 5.2 10*3/uL (ref 3.8–10.8)

## 2019-08-30 LAB — TSH: TSH: 3 mIU/L (ref 0.40–4.50)

## 2019-08-30 LAB — SARS-COV-2 ANTIBODY(IGG)SPIKE,SEMI-QUANTITATIVE: SARS COV1 AB(IGG)SPIKE,SEMI QN: <1 index (ref <1.00)

## 2019-08-31 ENCOUNTER — Encounter: Payer: Self-pay | Admitting: Family Medicine

## 2019-08-31 NOTE — Progress Notes (Signed)
Please inform patient of the following:  Covid antibody is negative. She is still susceptible to covid infection.  Her cholesterol levels went up a bit since last time. Recommend that she continue working on diet and exercise and we can recheck in a year.  All of her other labs are NORMAL.   Algis Greenhouse. Jerline Pain, MD 08/31/2019 9:54 AM

## 2019-09-03 ENCOUNTER — Telehealth: Payer: Self-pay | Admitting: Family Medicine

## 2019-09-03 NOTE — Telephone Encounter (Signed)
Patient is calling asking if someone could go over the test results.

## 2019-09-03 NOTE — Telephone Encounter (Signed)
Patient notified of results see lab note

## 2019-09-13 LAB — HM MAMMOGRAPHY

## 2019-09-14 ENCOUNTER — Encounter: Payer: Self-pay | Admitting: Family Medicine

## 2019-09-25 ENCOUNTER — Other Ambulatory Visit: Payer: Self-pay

## 2019-09-25 ENCOUNTER — Encounter: Payer: Self-pay | Admitting: Physician Assistant

## 2019-09-25 ENCOUNTER — Ambulatory Visit (INDEPENDENT_AMBULATORY_CARE_PROVIDER_SITE_OTHER): Payer: Medicare Other | Admitting: Family Medicine

## 2019-09-25 VITALS — BP 110/70 | HR 72 | Temp 97.9°F | Ht 66.5 in | Wt 146.2 lb

## 2019-09-25 DIAGNOSIS — R300 Dysuria: Secondary | ICD-10-CM | POA: Diagnosis not present

## 2019-09-25 LAB — POCT URINALYSIS DIPSTICK
Bilirubin, UA: NEGATIVE
Glucose, UA: NEGATIVE
Ketones, UA: NEGATIVE
Nitrite, UA: POSITIVE
Protein, UA: NEGATIVE
Spec Grav, UA: 1.02 (ref 1.010–1.025)
Urobilinogen, UA: 1 E.U./dL
pH, UA: 6 (ref 5.0–8.0)

## 2019-09-25 MED ORDER — NITROFURANTOIN MONOHYD MACRO 100 MG PO CAPS: 100.0000 mg | ORAL_CAPSULE | Freq: Two times a day (BID) | ORAL | 0 refills | Status: DC

## 2019-09-25 NOTE — Progress Notes (Signed)
   Stacy White is a 74 y.o. female who presents today for an office visit.  Assessment/Plan:  UTI History and UA symptoms consistent with UTI. Will start macrobid as this as worked well. Encouraged good oral hydration. Check urine culture. Discussed reasons to return to care.     Subjective:  HPI:  Pt c/o dysuria and frequency with urination, started on Friday. Went to Urgent care but left because they wanted her to do lab work and other testing. Pt took Azo for 3 days TID, last dose last night. Denies back pain, fever or chills.        Objective:  Physical Exam: BP 110/70 (BP Location: Left Arm, Patient Position: Sitting, Cuff Size: Normal)   Pulse 72   Temp 97.9 F (36.6 C) (Temporal)   Ht 5' 6.5" (1.689 m)   Wt 146 lb 4 oz (66.3 kg)   SpO2 96%   BMI 23.25 kg/m   Gen: No acute distress, resting comfortably Neuro: Grossly normal, moves all extremities Psych: Normal affect and thought content      Jaegar Croft M. Jerline Pain, MD 09/25/2019 12:18 PM

## 2019-09-25 NOTE — Patient Instructions (Signed)
It was nice to see you!  You have a urinary tract infection. Please start the antibiotic.  We will check a urine culture to make sure you do not have a resistant bacteria. We will call you if we need to change your medications.   Please make sure you are drinking plenty of fluids over the next few days.  If your symptoms do not improve over the next 5-7 days, or if they worsen, please let us know. Please also let us know if you have worsening back pain, fevers, chills, or body aches.   Take care, Dr Kiyoko Mcguirt  

## 2019-09-27 LAB — URINE CULTURE
MICRO NUMBER:: 10917356
SPECIMEN QUALITY:: ADEQUATE

## 2019-09-27 NOTE — Progress Notes (Signed)
Please inform patient of the following:  Urine culture confirms UTI. The antibiotic we have her on should treat this. Would like for her to let us know if her symptoms are not improving.  Algis Greenhouse. Jerline Pain, MD 09/27/2019 4:26 PM

## 2020-01-08 ENCOUNTER — Other Ambulatory Visit: Payer: Self-pay

## 2020-01-08 ENCOUNTER — Encounter: Payer: Self-pay | Admitting: Family Medicine

## 2020-01-08 ENCOUNTER — Ambulatory Visit (INDEPENDENT_AMBULATORY_CARE_PROVIDER_SITE_OTHER): Payer: Medicare Other | Admitting: Family Medicine

## 2020-01-08 VITALS — BP 131/78 | HR 90 | Temp 97.8°F | Ht 66.5 in | Wt 149.0 lb

## 2020-01-08 DIAGNOSIS — N390 Urinary tract infection, site not specified: Secondary | ICD-10-CM

## 2020-01-08 LAB — POCT URINALYSIS DIPSTICK
Bilirubin, UA: NEGATIVE
Glucose, UA: NEGATIVE
Ketones, UA: NEGATIVE
Nitrite, UA: NEGATIVE
Protein, UA: NEGATIVE
Spec Grav, UA: 1.01 (ref 1.010–1.025)
Urobilinogen, UA: NEGATIVE E.U./dL — AB
pH, UA: 6 (ref 5.0–8.0)

## 2020-01-08 MED ORDER — NITROFURANTOIN MONOHYD MACRO 100 MG PO CAPS: 100.0000 mg | ORAL_CAPSULE | Freq: Two times a day (BID) | ORAL | 0 refills | Status: DC

## 2020-01-08 NOTE — Patient Instructions (Signed)
It was nice to see you!  You have a urinary tract infection. Please start the antibiotic.  We will check a urine culture to make sure you do not have a resistant bacteria. We will call you if we need to change your medications.   Please make sure you are drinking plenty of fluids over the next few days.  If your symptoms do not improve over the next 5-7 days, or if they worsen, please let us know. Please also let us know if you have worsening back pain, fevers, chills, or body aches.   Take care, Dr Adin Laker  

## 2020-01-08 NOTE — Progress Notes (Signed)
   Stacy White is a 74 y.o. female who presents today for an office visit.  Assessment/Plan:  UTI No red flags.  UA consistent with UTI.  Will check urine culture.  Will start empiric Macrobid.  Courage good oral hydration.  Discussed reasons to return to care.  Follow-up as needed.     Subjective:  HPI: Patient here with UTI symptoms for the past few days.  Similar to prior UTIs.  Has had some burning with urination more frequency.  Trial over-the-counter Azo which is worked well.  No fevers or chills.  No back pain.      Objective:  Physical Exam: BP 131/78   Pulse 90   Temp 97.8 F (36.6 C) (Temporal)   Ht 5' 6.5" (1.689 m)   Wt 149 lb (67.6 kg)   SpO2 98%   BMI 23.69 kg/m   Gen: No acute distress, resting comfortably Neuro: Grossly normal, moves all extremities Psych: Normal affect and thought content      Burdette Gergely M. Jerline Pain, MD 01/08/2020 1:30 PM

## 2020-01-09 LAB — URINE CULTURE
MICRO NUMBER:: 11343438
SPECIMEN QUALITY:: ADEQUATE

## 2020-01-10 NOTE — Progress Notes (Signed)
Please inform patient of the following:  Urine culture inconclusive. Would like for her to finish her antibiotics and let us know if her symptoms have not improved.  [facility]. Jerline Pain, MD 01/10/2020 9:19 AM

## 2020-01-30 ENCOUNTER — Telehealth: Payer: Self-pay

## 2020-01-30 NOTE — Telephone Encounter (Signed)
Please advise 

## 2020-01-30 NOTE — Telephone Encounter (Signed)
There are not any scientific studies that show its effectiveness. Probably would not hurt for her to try it but I am not sure if it would be effective. Looking at the ingredients list online looks like it is just normal supplements like calcium, magnesium, potassium plus a probiotic.  Algis Greenhouse. Jerline Pain, MD 01/30/2020 10:30 AM

## 2020-01-30 NOTE — Telephone Encounter (Signed)
Pt state will try [place]. Still not feeling 100% drinking lots of water. Will call if no changes

## 2020-01-30 NOTE — Telephone Encounter (Signed)
Patient called in asking for Dr.Parker's advice on [place], states she saw an advertisement on TV for it. Wanted to know if this is something that would be okay to take seeming how she has reoccurring UTI's.

## 2020-02-06 DIAGNOSIS — H401131 Primary open-angle glaucoma, bilateral, mild stage: Secondary | ICD-10-CM | POA: Diagnosis not present

## 2020-02-06 DIAGNOSIS — H2512 Age-related nuclear cataract, left eye: Secondary | ICD-10-CM | POA: Diagnosis not present

## 2020-02-06 DIAGNOSIS — Z961 Presence of intraocular lens: Secondary | ICD-10-CM | POA: Diagnosis not present

## 2020-02-11 ENCOUNTER — Other Ambulatory Visit: Payer: Self-pay

## 2020-02-11 ENCOUNTER — Other Ambulatory Visit (HOSPITAL_COMMUNITY)
Admission: RE | Admit: 2020-02-11 | Discharge: 2020-02-11 | Disposition: A | Discharge status: Home / Self Care | Payer: Medicare Other | Source: Ambulatory Visit | Attending: Physician Assistant | Admitting: Physician Assistant

## 2020-02-11 ENCOUNTER — Ambulatory Visit (INDEPENDENT_AMBULATORY_CARE_PROVIDER_SITE_OTHER): Payer: Medicare Other | Admitting: Physician Assistant

## 2020-02-11 ENCOUNTER — Encounter: Payer: Self-pay | Admitting: Physician Assistant

## 2020-02-11 VITALS — BP 118/80 | HR 88 | Temp 97.6°F | Ht 66.5 in | Wt 147.5 lb

## 2020-02-11 DIAGNOSIS — N3289 Other specified disorders of bladder: Secondary | ICD-10-CM

## 2020-02-11 DIAGNOSIS — R300 Dysuria: Secondary | ICD-10-CM | POA: Diagnosis not present

## 2020-02-11 LAB — POCT URINALYSIS DIPSTICK
Bilirubin, UA: NEGATIVE
Glucose, UA: NEGATIVE
Ketones, UA: NEGATIVE
Nitrite, UA: POSITIVE
Protein, UA: NEGATIVE
Spec Grav, UA: 1.01 (ref 1.010–1.025)
Urobilinogen, UA: 0.2 E.U./dL
pH, UA: 5.5 (ref 5.0–8.0)

## 2020-02-11 MED ORDER — CEPHALEXIN 500 MG PO CAPS: 500.0000 mg | ORAL_CAPSULE | Freq: Two times a day (BID) | ORAL | 0 refills | Status: AC

## 2020-02-11 MED ORDER — CEPHALEXIN 500 MG PO CAPS
500.0000 mg | ORAL_CAPSULE | Freq: Two times a day (BID) | ORAL | 0 refills | Status: DC
Start: 2020-02-11 — End: 2020-02-11

## 2020-02-11 NOTE — Progress Notes (Signed)
Stacy White is a 75 y.o. female here for a new problem.  I acted as a Education administrator for [employer], PA-C Stacy Pickler, LPN   History of Present Illness:   Chief Complaint  Patient presents with  . Urinary symptoms    HPI   Urinary Symptoms Pt still c/o spasms at end of urine stream, thinks she never really got over the infection from 12/21. She saw her PCP at that time and was tx with Macrobid, urinary culture was inconclusive. Pt is scheduled to see Dr. Gloriann White on 2/16 at [facility]. Long personal history of UTIs. Having increased polyuria.  Took AZO last night at 7p. She has been taking this intermittently to help with her symptoms.  Denies: fever, chills, malaise, back pain, vaginal discharge/itching/pain    Past Medical History:  Diagnosis Date  . Cataract   . Family history of colon cancer    next colonoscopy due 2017  . Glaucoma   . Osteopenia   . PONV (postoperative nausea and vomiting)    last anethesia in the 80s     Social History   Tobacco Use  . Smoking status: Never Smoker  . Smokeless tobacco: Never Used  Substance Use Topics  . Alcohol use: No  . Drug use: No    Past Surgical History:  Procedure Laterality Date  . COLONOSCOPY  '97,'02,'07,'12   2012: Polypectomy.  Mild sigmoid diverticulosis (Dr. Vito White 2017  . EYE SURGERY    . GAS INSERTION  09/30/2011   Procedure: INSERTION OF GAS;  Surgeon: Stacy Pedro, MD;  Location: [facility];  Service: Ophthalmology;  Laterality: Right;  [id number]  . PARS PLANA VITRECTOMY  09/30/2011   Procedure: PARS PLANA VITRECTOMY WITH 25 GAUGE;  Surgeon: Stacy Pedro, MD;  Location: [place];  Service: Ophthalmology;  Laterality: Right;  Macular Hole  . TUBAL LIGATION    . TYMPANOPLASTY    . WISDOM TOOTH EXTRACTION     x2    Family History  Problem Relation Age of Onset  . Hypertension Mother   . COPD Mother   . Macular degeneration Mother   . Cancer Father        colon  . Hypertension Father   . Colon  cancer Father 30       died from colon ca  . Stomach cancer Neg Hx     No Known Allergies  Current Medications:   Current Outpatient Medications:  .  aspirin 325 MG tablet, Take 325 mg by mouth once. Every monday, Disp: , Rfl:  .  b complex vitamins tablet, Take 1 tablet by mouth daily., Disp: , Rfl:  .  Calcium Carbonate-Vit D-Min (CALCIUM 1200 PO), Take 2 tablets by mouth daily., Disp: , Rfl:  .  cephALEXin (KEFLEX) 500 MG capsule, Take 1 capsule (500 mg total) by mouth 2 (two) times daily., Disp: 14 capsule, Rfl: 0 .  Cholecalciferol (VITAMIN D) 1000 UNITS capsule, Take 1,000 Units by mouth daily., Disp: , Rfl:  .  latanoprost (XALATAN) 0.005 % ophthalmic solution, Place 1 drop into both eyes at bedtime., Disp: , Rfl:  .  Lutein 6 MG CAPS, Take 6 mg by mouth daily., Disp: , Rfl:    Review of Systems:   ROS  Negative unless otherwise specified per HPI.  Vitals:   Vitals:   02/11/20 1016  BP: 118/80  Pulse: 88  Temp: 97.6 F (36.4 C)  TempSrc: Temporal  SpO2: 98%  Weight: 147 lb 8 oz (66.9 kg)  Height: 5' 6.5" (1.689 m)     Body mass index is 23.45 kg/m.  Physical Exam:   Physical Exam Vitals and nursing note reviewed.  Constitutional:      General: She is not in acute distress.    Appearance: She is well-developed. She is not ill-appearing, toxic-appearing or sickly-appearing.  Cardiovascular:     Rate and Rhythm: Normal rate and regular rhythm.     Pulses: Normal pulses.     Heart sounds: Normal heart sounds, S1 normal and S2 normal.     Comments: No LE edema Pulmonary:     Effort: Pulmonary effort is normal.     Breath sounds: Normal breath sounds.  Genitourinary:    Vagina: Normal.     Cervix: Normal.  Skin:    General: Skin is warm, dry and intact.  Neurological:     Mental Status: She is alert.     GCS: GCS eye subscore is 4. GCS verbal subscore is 5. GCS motor subscore is 6.  Psychiatric:        Mood and Affect: Mood and affect normal.         Speech: Speech normal.        Behavior: Behavior normal. Behavior is cooperative.     Results for orders placed or performed in visit on 02/11/20  POCT urinalysis dipstick  Result Value Ref Range   Color, UA orange    Clarity, UA cloudy    Glucose, UA Negative Negative   Bilirubin, UA negative    Ketones, UA negative    Spec Grav, UA 1.010 1.010 - 1.025   Blood, UA 2+    pH, UA 5.5 5.0 - 8.0   Protein, UA Negative Negative   Urobilinogen, UA 0.2 0.2 or 1.0 E.U./dL   Nitrite, UA positive    Leukocytes, UA Large (3+) (A) Negative   Appearance     Odor      Assessment and Plan:   [name] was seen today for urinary symptoms.  Diagnoses and all orders for this visit:  Bladder spasms No red flags on exam. Discussed holding AZO to see how severe her symptoms are. Oral keflex antibiotic has been sent in for patient in the meantime. We have also done a vaginal swab to r/o yeast or BV that may be contributing to symptoms. -     POCT urinalysis dipstick -     Urine Culture -     Cervicovaginal ancillary only( )  Other orders -     cephALEXin (KEFLEX) 500 MG capsule; Take 1 capsule (500 mg total) by mouth 2 (two) times daily.   CMA or LPN served as scribe during this visit. History, Physical, and Plan performed by medical provider. The above documentation has been reviewed and is accurate and complete.  Inda Coke, PA-C

## 2020-02-11 NOTE — Patient Instructions (Signed)
It was great to see you!  I have sent in a different antibiotic for you to trial for your urinary symptoms.  I will be in touch with your urine culture and vaginal swab results in a few days.  General instructions  Make sure you: ? Pee until your bladder is empty. ? Do not hold pee for a long time. ? Empty your bladder after sex. ? Wipe from front to back after pooping if you are a female. Use each tissue one time when you wipe.  Drink enough fluid to keep your pee pale yellow.  Keep all follow-up visits as told by your doctor. This is important. Contact a doctor if:  You do not get better after 1-2 days.  Your symptoms go away and then come back. Get help right away if:  You have very bad back pain.  You have very bad pain in your lower belly.  You have a fever.  You are sick to your stomach (nauseous).  You are throwing up.   Take care,  Inda Coke PA-C

## 2020-02-12 LAB — CERVICOVAGINAL ANCILLARY ONLY
Bacterial Vaginitis (gardnerella): NEGATIVE
Candida Glabrata: NEGATIVE
Candida Vaginitis: NEGATIVE
Comment: NEGATIVE
Comment: NEGATIVE
Comment: NEGATIVE

## 2020-02-13 LAB — URINE CULTURE
MICRO NUMBER:: 11448642
SPECIMEN QUALITY:: ADEQUATE

## 2020-03-05 DIAGNOSIS — R350 Frequency of micturition: Secondary | ICD-10-CM | POA: Diagnosis not present

## 2020-03-05 DIAGNOSIS — R300 Dysuria: Secondary | ICD-10-CM | POA: Diagnosis not present

## 2020-03-05 DIAGNOSIS — R351 Nocturia: Secondary | ICD-10-CM | POA: Diagnosis not present

## 2020-03-05 DIAGNOSIS — N3020 Other chronic cystitis without hematuria: Secondary | ICD-10-CM | POA: Diagnosis not present

## 2020-03-11 DIAGNOSIS — Z23 Encounter for immunization: Secondary | ICD-10-CM | POA: Diagnosis not present

## 2020-06-04 DIAGNOSIS — N3020 Other chronic cystitis without hematuria: Secondary | ICD-10-CM | POA: Diagnosis not present

## 2020-07-29 DIAGNOSIS — D225 Melanocytic nevi of trunk: Secondary | ICD-10-CM | POA: Diagnosis not present

## 2020-07-29 DIAGNOSIS — D1801 Hemangioma of skin and subcutaneous tissue: Secondary | ICD-10-CM | POA: Diagnosis not present

## 2020-07-29 DIAGNOSIS — L821 Other seborrheic keratosis: Secondary | ICD-10-CM | POA: Diagnosis not present

## 2020-07-29 DIAGNOSIS — L720 Epidermal cyst: Secondary | ICD-10-CM | POA: Diagnosis not present

## 2020-08-07 ENCOUNTER — Ambulatory Visit: Payer: Medicare Other

## 2020-08-29 ENCOUNTER — Ambulatory Visit: Payer: Medicare Other | Admitting: Family Medicine

## 2020-09-01 ENCOUNTER — Telehealth: Payer: Self-pay | Admitting: Family Medicine

## 2020-09-01 NOTE — Telephone Encounter (Signed)
Spoke with patient she state she no longer lives in [place], she moved to [place] and has not found a new pcp at this time.

## 2020-09-04 ENCOUNTER — Ambulatory Visit: Payer: Medicare Other | Admitting: Family Medicine

## 2021-01-01 ENCOUNTER — Encounter: Payer: Self-pay | Admitting: Internal Medicine

## 2021-10-12 ENCOUNTER — Encounter: Payer: Self-pay | Admitting: *Deleted

## 2021-12-31 ENCOUNTER — Encounter: Payer: Self-pay | Admitting: *Deleted
